# Patient Record
Sex: Female | Born: 1945
Health system: Southern US, Community
[De-identification: ages and names within clinical notes are randomized; demographics above are authoritative.]

## PROBLEM LIST (undated history)

## (undated) HISTORY — PX: TUBAL LIGATION: SHX77

## (undated) HISTORY — PX: BREAST BIOPSY: SHX20

---

## 1997-07-25 ENCOUNTER — Other Ambulatory Visit: Admission: RE | Admit: 1997-07-25 | Discharge: 1997-07-25 | Payer: Self-pay | Admitting: Obstetrics and Gynecology

## 1997-08-02 ENCOUNTER — Ambulatory Visit (HOSPITAL_COMMUNITY): Admission: RE | Admit: 1997-08-02 | Discharge: 1997-08-02 | Payer: Self-pay | Admitting: Obstetrics and Gynecology

## 2001-07-07 ENCOUNTER — Other Ambulatory Visit: Admission: RE | Admit: 2001-07-07 | Discharge: 2001-07-07 | Payer: Self-pay | Admitting: *Deleted

## 2001-07-14 ENCOUNTER — Other Ambulatory Visit: Admission: RE | Admit: 2001-07-14 | Discharge: 2001-07-14 | Payer: Self-pay | Admitting: *Deleted

## 2001-07-14 ENCOUNTER — Encounter (INDEPENDENT_AMBULATORY_CARE_PROVIDER_SITE_OTHER): Payer: Self-pay | Admitting: Specialist

## 2001-07-27 ENCOUNTER — Ambulatory Visit (HOSPITAL_COMMUNITY): Admission: RE | Admit: 2001-07-27 | Discharge: 2001-07-27 | Payer: Self-pay | Admitting: Oncology

## 2001-07-27 ENCOUNTER — Encounter: Payer: Self-pay | Admitting: Oncology

## 2001-07-29 ENCOUNTER — Other Ambulatory Visit: Admission: RE | Admit: 2001-07-29 | Discharge: 2001-07-29 | Payer: Self-pay | Admitting: Oncology

## 2001-11-11 ENCOUNTER — Ambulatory Visit: Admission: RE | Admit: 2001-11-11 | Discharge: 2002-01-15 | Payer: Self-pay | Admitting: *Deleted

## 2002-02-19 ENCOUNTER — Ambulatory Visit: Admission: RE | Admit: 2002-02-19 | Discharge: 2002-02-19 | Payer: Self-pay | Admitting: *Deleted

## 2002-02-26 ENCOUNTER — Ambulatory Visit: Admission: RE | Admit: 2002-02-26 | Discharge: 2002-02-26 | Payer: Self-pay | Admitting: *Deleted

## 2004-03-12 ENCOUNTER — Ambulatory Visit: Payer: Self-pay | Admitting: Oncology

## 2004-07-02 ENCOUNTER — Ambulatory Visit: Payer: Self-pay | Admitting: Oncology

## 2004-12-17 ENCOUNTER — Ambulatory Visit: Payer: Self-pay | Admitting: Oncology

## 2005-05-27 ENCOUNTER — Ambulatory Visit: Payer: Self-pay | Admitting: Oncology

## 2005-11-18 ENCOUNTER — Ambulatory Visit: Payer: Self-pay | Admitting: Oncology

## 2006-05-05 ENCOUNTER — Ambulatory Visit: Payer: Self-pay | Admitting: Oncology

## 2006-10-22 ENCOUNTER — Ambulatory Visit: Payer: Self-pay | Admitting: Oncology

## 2014-01-24 DIAGNOSIS — M25512 Pain in left shoulder: Secondary | ICD-10-CM | POA: Insufficient documentation

## 2014-01-24 DIAGNOSIS — M25522 Pain in left elbow: Secondary | ICD-10-CM

## 2014-01-24 HISTORY — DX: Pain in left elbow: M25.522

## 2014-01-24 HISTORY — DX: Pain in left shoulder: M25.512

## 2014-02-17 ENCOUNTER — Other Ambulatory Visit: Payer: Self-pay | Admitting: Orthopedic Surgery

## 2014-02-17 DIAGNOSIS — M25512 Pain in left shoulder: Secondary | ICD-10-CM

## 2014-02-17 DIAGNOSIS — S52125D Nondisplaced fracture of head of left radius, subsequent encounter for closed fracture with routine healing: Secondary | ICD-10-CM

## 2014-02-17 HISTORY — DX: Nondisplaced fracture of head of left radius, subsequent encounter for closed fracture with routine healing: S52.125D

## 2014-03-03 ENCOUNTER — Ambulatory Visit
Admission: RE | Admit: 2014-03-03 | Discharge: 2014-03-03 | Disposition: A | Discharge status: Home / Self Care | Payer: Medicare Other | Source: Ambulatory Visit | Attending: Orthopedic Surgery | Admitting: Orthopedic Surgery

## 2014-03-03 DIAGNOSIS — M25512 Pain in left shoulder: Secondary | ICD-10-CM

## 2014-04-25 DIAGNOSIS — Z9889 Other specified postprocedural states: Secondary | ICD-10-CM

## 2014-04-25 HISTORY — DX: Other specified postprocedural states: Z98.890

## 2015-06-12 DIAGNOSIS — C8589 Other specified types of non-Hodgkin lymphoma, extranodal and solid organ sites: Secondary | ICD-10-CM | POA: Diagnosis not present

## 2015-06-12 DIAGNOSIS — R0609 Other forms of dyspnea: Secondary | ICD-10-CM | POA: Diagnosis not present

## 2015-06-12 DIAGNOSIS — L989 Disorder of the skin and subcutaneous tissue, unspecified: Secondary | ICD-10-CM | POA: Diagnosis not present

## 2016-08-28 DIAGNOSIS — R413 Other amnesia: Secondary | ICD-10-CM | POA: Diagnosis not present

## 2016-08-28 DIAGNOSIS — D509 Iron deficiency anemia, unspecified: Secondary | ICD-10-CM | POA: Diagnosis not present

## 2016-08-28 DIAGNOSIS — Z9221 Personal history of antineoplastic chemotherapy: Secondary | ICD-10-CM | POA: Diagnosis not present

## 2016-08-28 DIAGNOSIS — Z8572 Personal history of non-Hodgkin lymphomas: Secondary | ICD-10-CM | POA: Diagnosis not present

## 2016-08-28 DIAGNOSIS — Z923 Personal history of irradiation: Secondary | ICD-10-CM | POA: Diagnosis not present

## 2016-08-28 DIAGNOSIS — R300 Dysuria: Secondary | ICD-10-CM | POA: Diagnosis not present

## 2017-04-15 DIAGNOSIS — R0602 Shortness of breath: Secondary | ICD-10-CM | POA: Diagnosis not present

## 2017-06-25 DIAGNOSIS — L600 Ingrowing nail: Secondary | ICD-10-CM | POA: Insufficient documentation

## 2017-06-25 DIAGNOSIS — L603 Nail dystrophy: Secondary | ICD-10-CM | POA: Insufficient documentation

## 2017-06-25 HISTORY — DX: Ingrowing nail: L60.0

## 2017-08-28 ENCOUNTER — Other Ambulatory Visit: Payer: Self-pay

## 2017-08-28 ENCOUNTER — Emergency Department (HOSPITAL_COMMUNITY): Payer: Medicare Other

## 2017-08-28 ENCOUNTER — Encounter (HOSPITAL_COMMUNITY): Payer: Self-pay | Admitting: Emergency Medicine

## 2017-08-28 ENCOUNTER — Emergency Department (HOSPITAL_COMMUNITY)
Admission: EM | Admit: 2017-08-28 | Discharge: 2017-08-29 | Disposition: A | Discharge status: Home / Self Care | Payer: Medicare Other | Attending: Emergency Medicine | Admitting: Emergency Medicine

## 2017-08-28 DIAGNOSIS — R791 Abnormal coagulation profile: Secondary | ICD-10-CM | POA: Insufficient documentation

## 2017-08-28 DIAGNOSIS — Z79899 Other long term (current) drug therapy: Secondary | ICD-10-CM | POA: Diagnosis not present

## 2017-08-28 DIAGNOSIS — R410 Disorientation, unspecified: Secondary | ICD-10-CM

## 2017-08-28 DIAGNOSIS — G4751 Confusional arousals: Secondary | ICD-10-CM | POA: Diagnosis not present

## 2017-08-28 DIAGNOSIS — Z7984 Long term (current) use of oral hypoglycemic drugs: Secondary | ICD-10-CM | POA: Insufficient documentation

## 2017-08-28 DIAGNOSIS — R413 Other amnesia: Secondary | ICD-10-CM

## 2017-08-28 LAB — I-STAT CHEM 8, ED
BUN: 13 mg/dL (ref 8–23)
CHLORIDE: 106 mmol/L (ref 98–111)
Calcium, Ion: 1.12 mmol/L — ABNORMAL LOW (ref 1.15–1.40)
Creatinine, Ser: 1 mg/dL (ref 0.44–1.00)
Glucose, Bld: 110 mg/dL — ABNORMAL HIGH (ref 70–99)
HEMATOCRIT: 37 % (ref 36.0–46.0)
HEMOGLOBIN: 12.6 g/dL (ref 12.0–15.0)
POTASSIUM: 3.8 mmol/L (ref 3.5–5.1)
Sodium: 140 mmol/L (ref 135–145)
TCO2: 23 mmol/L (ref 22–32)

## 2017-08-28 LAB — DIFFERENTIAL
Abs Immature Granulocytes: 0 10*3/uL (ref 0.0–0.1)
BASOS ABS: 0.1 10*3/uL (ref 0.0–0.1)
BASOS PCT: 1 %
EOS PCT: 4 %
Eosinophils Absolute: 0.3 10*3/uL (ref 0.0–0.7)
IMMATURE GRANULOCYTES: 0 %
LYMPHS PCT: 27 %
Lymphs Abs: 2.1 10*3/uL (ref 0.7–4.0)
Monocytes Absolute: 0.6 10*3/uL (ref 0.1–1.0)
Monocytes Relative: 8 %
NEUTROS ABS: 4.8 10*3/uL (ref 1.7–7.7)
Neutrophils Relative %: 60 %

## 2017-08-28 LAB — COMPREHENSIVE METABOLIC PANEL
ALBUMIN: 3.9 g/dL (ref 3.5–5.0)
ALK PHOS: 56 U/L (ref 38–126)
ALT: 19 U/L (ref 0–44)
ANION GAP: 9 (ref 5–15)
AST: 19 U/L (ref 15–41)
BUN: 13 mg/dL (ref 8–23)
CALCIUM: 9.2 mg/dL (ref 8.9–10.3)
CHLORIDE: 106 mmol/L (ref 98–111)
CO2: 24 mmol/L (ref 22–32)
Creatinine, Ser: 1.02 mg/dL — ABNORMAL HIGH (ref 0.44–1.00)
GFR calc Af Amer: >60 mL/min (ref >60)
GFR calc non Af Amer: 53 mL/min — ABNORMAL LOW (ref >60)
GLUCOSE: 116 mg/dL — AB (ref 70–99)
Potassium: 4 mmol/L (ref 3.5–5.1)
Sodium: 139 mmol/L (ref 135–145)
Total Bilirubin: 0.7 mg/dL (ref 0.3–1.2)
Total Protein: 6.9 g/dL (ref 6.5–8.1)

## 2017-08-28 LAB — CBC
HEMATOCRIT: 39 % (ref 36.0–46.0)
HEMOGLOBIN: 12.4 g/dL (ref 12.0–15.0)
MCH: 27.3 pg (ref 26.0–34.0)
MCHC: 31.8 g/dL (ref 30.0–36.0)
MCV: 85.7 fL (ref 78.0–100.0)
Platelets: 233 10*3/uL (ref 150–400)
RBC: 4.55 MIL/uL (ref 3.87–5.11)
RDW: 13.6 % (ref 11.5–15.5)
WBC: 8.2 10*3/uL (ref 4.0–10.5)

## 2017-08-28 LAB — PROTIME-INR
INR: 1.01
PROTHROMBIN TIME: 13.2 s (ref 11.4–15.2)

## 2017-08-28 LAB — APTT: aPTT: 42 seconds — ABNORMAL HIGH (ref 24–36)

## 2017-08-28 LAB — I-STAT TROPONIN, ED: Troponin i, poc: 0 ng/mL (ref 0.00–0.08)

## 2017-08-28 LAB — CBG MONITORING, ED: GLUCOSE-CAPILLARY: 109 mg/dL — AB (ref 70–99)

## 2017-08-28 MED ORDER — LORAZEPAM 2 MG/ML IJ SOLN
1.0000 mg | Freq: Once | INTRAMUSCULAR | Status: AC
  Administered 2017-08-28: 1 mg via INTRAVENOUS
  Filled 2017-08-28: qty 1

## 2017-08-28 NOTE — ED Notes (Signed)
Patient transported to MRI 

## 2017-08-28 NOTE — ED Provider Notes (Addendum)
[facility] EMERGENCY DEPARTMENT Provider Note   CSN: [id number] Arrival date & time: 08/28/17  1829     History   Chief Complaint Chief Complaint  Patient presents with  . Cerebrovascular Accident    HPI Nicole Patton is a 72 y.o. female who presents from PCP for MRI for concern for CVA.  Patient has been having memory issues and episodes of not making sense for the past several months.  Patient was found to have a UTI at her PCP, but she went to have a CT scan ordered by her PCP this morning and was called that she could have bleeding on her brain and that she needed to go to the emergency department to have an MRI.  Patient denies any vision changes, headache, nausea, vomiting, dizziness, lightheadedness, weakness, numbness or tingling, chest pain, shortness of breath, abdominal pain. She is already taking ABX for UTI.  HPI  History reviewed. No pertinent past medical history.  There are no active problems to display for this patient.   History reviewed. No pertinent surgical history.   OB History   None      Home Medications    Prior to Admission medications   Medication Sig Start Date End Date Taking? Authorizing Provider  ciprofloxacin (CIPRO) 500 MG tablet Take 500 mg by mouth 2 (two) times daily. For 10 days 08/25/17 09/04/17 Yes [provider]  ibuprofen (ADVIL,MOTRIN) 200 MG tablet Take 400 mg by mouth every 6 (six) hours as needed for mild pain or moderate pain.   Yes [provider]  loratadine (CLARITIN) 10 MG tablet Take 10 mg by mouth daily.   Yes [provider]  lovastatin (MEVACOR) 10 MG tablet Take 10 mg by mouth daily. 06/25/17  Yes [provider]  metFORMIN (GLUCOPHAGE) 500 MG tablet Take 500 mg by mouth daily. 08/12/17  Yes [provider]  mirabegron ER (MYRBETRIQ) 25 MG TB24 tablet Take 25 mg by mouth daily.   Yes [provider]  omeprazole (PRILOSEC) 20 MG capsule Take 20 mg by  mouth daily.   Yes [provider]    Family History No family history on file.  Social History Social History   Tobacco Use  . Smoking status: Not on file  Substance Use Topics  . Alcohol use: Not on file  . Drug use: Not on file     Allergies   Codeine; Penicillin g; and Shellfish allergy   Review of Systems Review of Systems  Constitutional: Negative for chills and fever.  HENT: Negative for facial swelling and sore throat.   Respiratory: Negative for shortness of breath.   Cardiovascular: Negative for chest pain.  Gastrointestinal: Negative for abdominal pain, nausea and vomiting.  Genitourinary: Negative for dysuria.  Musculoskeletal: Negative for back pain.  Skin: Negative for rash and wound.  Neurological: Negative for headaches.  Psychiatric/Behavioral: The patient is not nervous/anxious.      Physical Exam Updated Vital Signs BP (!) 133/56   Pulse 82   Temp 98.7 F (37.1 C) (Oral)   Resp 13   Ht 5\' 8"  (1.727 m)   Wt 97.5 kg (215 lb)   SpO2 98%   BMI 32.69 kg/m   Physical Exam  Constitutional: She appears well-developed and well-nourished. No distress.  HENT:  Head: Normocephalic and atraumatic.  Mouth/Throat: Oropharynx is clear and moist. No oropharyngeal exudate.  Eyes: Pupils are equal, round, and reactive to light. Conjunctivae and EOM are normal. Right eye exhibits no  discharge. Left eye exhibits no discharge. No scleral icterus.  Neck: Normal range of motion. Neck supple. No thyromegaly present.  Cardiovascular: Normal rate, regular rhythm, normal heart sounds and intact distal pulses. Exam reveals no gallop and no friction rub.  No murmur heard. Pulmonary/Chest: Effort normal and breath sounds normal. No stridor. No respiratory distress. She has no wheezes. She has no rales.  Abdominal: Soft. Bowel sounds are normal. She exhibits no distension. There is no tenderness. There is no rebound and no guarding.  Musculoskeletal: She  exhibits no edema.  Lymphadenopathy:    She has no cervical adenopathy.  Neurological: She is alert. Coordination normal.  CN 3-12 intact; normal sensation throughout; 5/5 strength in all 4 extremities; equal bilateral grip strength; normal heel-to-shin, no ataxia on finger-to-nose  Skin: Skin is warm and dry. No rash noted. She is not diaphoretic. No pallor.  Psychiatric: She has a normal mood and affect.  Nursing note and vitals reviewed.    ED Treatments / Results  Labs (all labs ordered are listed, but only abnormal results are displayed) Labs Reviewed  APTT - Abnormal; Notable for the following components:      Result Value   aPTT 42 (*)    All other components within normal limits  COMPREHENSIVE METABOLIC PANEL - Abnormal; Notable for the following components:   Glucose, Bld 116 (*)    Creatinine, Ser 1.02 (*)    GFR calc non Af Amer 53 (*)    All other components within normal limits  CBG MONITORING, ED - Abnormal; Notable for the following components:   Glucose-Capillary 109 (*)    All other components within normal limits  I-STAT CHEM 8, ED - Abnormal; Notable for the following components:   Glucose, Bld 110 (*)    Calcium, Ion 1.12 (*)    All other components within normal limits  CBC  DIFFERENTIAL  PROTIME-INR  I-STAT TROPONIN, ED    EKG None  Radiology No results found.  Procedures Procedures (including critical care time)  Medications Ordered in ED Medications  LORazepam (ATIVAN) injection 1 mg (has no administration in time range)     Initial Impression / Assessment and Plan / ED Course  I have reviewed the triage vital signs and the nursing notes.  Pertinent labs & imaging results that were available during my care of the patient were reviewed by me and considered in my medical decision making (see chart for details).     Patient's labs are unremarkable except for APTT 42 and creatinine 1.02.  MRI is pending. At shift change, patient care  transferred to Charlann Lange, PA-C for continued evaluation, follow up of MRI and determination of disposition. Anticipate discharge if MRI negative. Patient has antibiotics for UTI and will follow up to PCP for further workup of pregressively worsening memory issues.   Final Clinical Impressions(s) / ED Diagnoses   Final diagnoses:  None    ED Discharge Orders    None         Frederica Kuster, PA-C 08/28/17 2205    Julianne Rice, MD 09/02/17 [phone]

## 2017-08-28 NOTE — ED Provider Notes (Addendum)
Sent from PCP for MR after abnormal CT, per daughter,upsti this am Months of confusion,, worsening memory, intermittent UTI on Monday, on abx If neg, can go home to f/u with PCP  CT results from PACs:  IMPRESSION: 1. Small focus of high attenuation in the right cerebellum of uncertain significance. Small petechial hemorrhage is difficult to exclude. 2. More vague peripheral slightly higher attenuation focus in the right posterior parietal region. If small petechial hemorrhage or hemorrhagic metastasis would be considerations, then MRI of the brain is recommended. 3. Atrophy and changes of small vessel ischemic change particularly in the posterior periventricular white matter.  12:15 - patient in MRI now.  1:30 - MR shows no acute findings. Motion degraded study.   IMPRESSION: 1. Motion degraded study. 2. Small nonspecific right lateral parietal cortex lesion with features suggesting cavernoma. Suboptimal assessment due to motion artifact. Consider repeat MRI of the brain with and without contrast when patient is able to hold still, preferably as an outpatient. 3. Tiny focus of susceptibility hypointensity within the right inferior cerebellum corresponding to density on CT, probably a calcification, less likely petechial hemorrhage. 4. No evidence for stroke or mass effect. 5. Moderate chronic microvascular ischemic changes and mild parenchymal volume loss of the brain.  These results were discussed with the patient and daughter, including consideration for need for outpatient MRI w and w/o CM. All questions answered. She and daughter are comfortable with discharge home.     Charlann Lange, PA-C 08/29/17 0015    Charlann Lange, PA-C 08/29/17 0131    Julianne Rice, MD 09/02/17 [phone]

## 2017-08-28 NOTE — ED Triage Notes (Signed)
Pt sent by doctor, CT done this morning that is concerning for "bleeding on brain" per pt's daughter. Family reports pt has been having memory issues in the last few weeks. A&o X 4 at this time, moves extremities well.

## 2017-08-28 NOTE — ED Provider Notes (Signed)
Patient placed in Quick Look pathway, seen and evaluated   Chief Complaint: memory loss  HPI:   Nicole Patton is a 72 y.o. female who presents to the ED with her daughter after getting a phone call from her PCP. Patient's daughter states that the patient has had memory problems and has started talking that doesn't make sense. Patient was evaluated by her PCP today and started on antibiotics for UTI and had CT of head. PCP called and told patient's daughter that it appears she may have a bleed and to come in for MRI.   ROS: GU: UTI  Physical Exam:  BP 128/74 (BP Location: Right Arm)   Pulse 96   Temp 98.7 F (37.1 C) (Oral)   Resp 16   Ht 5\' 8"  (1.727 m)   Wt 97.5 kg (215 lb)   SpO2 98%   BMI 32.69 kg/m    Gen: No distress  Neuro: Awake and Alert  Skin: Warm and dry      Initiation of care has begun. The patient has been counseled on the process, plan, and necessity for staying for the completion/evaluation, and the remainder of the medical screening examination    Ashley Murrain, NP 08/28/17 1859    Noemi Chapel, MD 09/02/17 2049

## 2017-08-29 NOTE — Discharge Instructions (Addendum)
Follow up with Dr. Allean Found for recheck and any further outpatient studies necessary. The report from the MRI is provided here to share with your doctor and can be found in the [facility] system for which your doctor has access.  IMPRESSION: 1. Motion degraded study. 2. Small nonspecific right lateral parietal cortex lesion with features suggesting cavernoma. Suboptimal assessment due to motion artifact. Consider repeat MRI of the brain with and without contrast when patient is able to hold still, preferably as an outpatient. 3. Tiny focus of susceptibility hypointensity within the right inferior cerebellum corresponding to density on CT, probably a calcification, less likely petechial hemorrhage. 4. No evidence for stroke or mass effect. 5. Moderate chronic microvascular ischemic changes and mild parenchymal volume loss of the brain.

## 2017-09-29 DIAGNOSIS — G939 Disorder of brain, unspecified: Secondary | ICD-10-CM

## 2017-09-29 HISTORY — DX: Disorder of brain, unspecified: G93.9

## 2017-09-30 DIAGNOSIS — Z803 Family history of malignant neoplasm of breast: Secondary | ICD-10-CM | POA: Diagnosis not present

## 2017-09-30 DIAGNOSIS — Z8572 Personal history of non-Hodgkin lymphomas: Secondary | ICD-10-CM | POA: Diagnosis not present

## 2017-09-30 DIAGNOSIS — R413 Other amnesia: Secondary | ICD-10-CM

## 2017-12-04 NOTE — Progress Notes (Signed)
NEUROLOGY CONSULTATION NOTE  Nicole Patton MRN: [id number] DOB: Aug 04, 1945  Referring provider: Consuella Lose, MD Primary care provider: Heide Scales, NP  Reason for consult:  stroke  HISTORY OF PRESENT ILLNESS: Nicole Patton is a 72 year old right-handed female with hypercholesterolemia, diabetes and remote history of lymphoma who presents for stroke.  She is accompanied by her sister who supplements history.  History is also supplemented by ED and referring providers note.  MRI of brain from July and October personally reviewed.  In July she fell and hit her head while walking down steps.  She doesn't think she lost consciousness.  Afterward, she developed memory problems.  She used to be a Chief Operating Officer.  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  She denies disorientation driving on familiar routes.  She does not think she was repeating questions (nobody called her out on it).  She had a CT of the head on 08/28/17, which reportedly showed an abnormality on the brain.  She was told to go to the ED that day.   MRI of brain without contrast was performed, which demonstrated a small lesion in the right lateral parietal cortex suspicious for cavernoma.  She was referred to Dr. Kathyrn Sheriff at [facility] in August.  MRI of brain with and without contrast was performed, demonstrating chronic small vessel ischemic changes as well as a heterogeneously enhancing lesion in the right parietal-occipital cortex, however enhancement pattern now appeared more gyriform, felt to more likely be a subacute infarct.  Repeat imaging in 6 months was recommended.  She subsequently had a repeat MRI of brain with and without contrast on 11/24/17 which demonstrated stable focus of susceptibility artifact in the right parieto-occipital lobe.  Given that the size had remained unchanged, and no presence of new lesions was seen, malignancy was felt unlikely.  She  currently feels better.  PAST MEDICAL HISTORY: Hypercholesterolemia Type 2 diabetes  Lymphoma  PAST SURGICAL HISTORY: No past surgical history on file.  MEDICATIONS: Current Outpatient Medications on File Prior to Visit  Medication Sig Dispense Refill  . ibuprofen (ADVIL,MOTRIN) 200 MG tablet Take 400 mg by mouth every 6 (six) hours as needed for mild pain or moderate pain.    Marland Kitchen loratadine (CLARITIN) 10 MG tablet Take 10 mg by mouth daily.    Marland Kitchen lovastatin (MEVACOR) 10 MG tablet Take 10 mg by mouth daily.  0  . metFORMIN (GLUCOPHAGE) 500 MG tablet Take 500 mg by mouth daily.  0  . mirabegron ER (MYRBETRIQ) 25 MG TB24 tablet Take 25 mg by mouth daily.    Marland Kitchen omeprazole (PRILOSEC) 20 MG capsule Take 20 mg by mouth daily.     No current facility-administered medications on file prior to visit.     ALLERGIES: Allergies  Allergen Reactions  . Codeine Nausea And Vomiting  . Penicillin G Swelling    Has patient had a PCN reaction causing immediate rash, facial/tongue/throat swelling, SOB or lightheadedness with hypotension: Yes Has patient had a PCN reaction causing severe rash involving mucus membranes or skin necrosis: No Has patient had a PCN reaction that required hospitalization: No Has patient had a PCN reaction occurring within the last 10 years: No If all of the above answers are "NO", then may proceed with Cephalosporin use.   . Shellfish Allergy Hives and Rash    FAMILY HISTORY: No family history on file.  SOCIAL HISTORY: Social History   Socioeconomic History  .  Marital status: Divorced    Spouse name: Not on file  . Number of children: Not on file  . Years of education: Not on file  . Highest education level: Not on file  Occupational History  . Not on file  Social Needs  . Financial resource strain: Not on file  . Food insecurity:    Worry: Not on file    Inability: Not on file  . Transportation needs:    Medical: Not on file    Non-medical: Not on file    Tobacco Use  . Smoking status: Not on file  Substance and Sexual Activity  . Alcohol use: Not on file  . Drug use: Not on file  . Sexual activity: Not on file  Lifestyle  . Physical activity:    Days per week: Not on file    Minutes per session: Not on file  . Stress: Not on file  Relationships  . Social connections:    Talks on phone: Not on file    Gets together: Not on file    Attends religious service: Not on file    Active member of club or organization: Not on file    Attends meetings of clubs or organizations: Not on file    Relationship status: Not on file  . Intimate partner violence:    Fear of current or ex partner: Not on file    Emotionally abused: Not on file    Physically abused: Not on file    Forced sexual activity: Not on file  Other Topics Concern  . Not on file  Social History Narrative  . Not on file    REVIEW OF SYSTEMS: Constitutional: No fevers, chills, or sweats, no generalized fatigue, change in appetite Eyes: No visual changes, double vision, eye pain Ear, nose and throat: No hearing loss, ear pain, nasal congestion, sore throat Cardiovascular: No chest pain, palpitations Respiratory:  No shortness of breath at rest or with exertion, wheezes GastrointestinaI: No nausea, vomiting, diarrhea, abdominal pain, fecal incontinence Genitourinary:  No dysuria, urinary retention or frequency Musculoskeletal:  No neck pain, back pain Integumentary: No rash, pruritus, skin lesions Neurological: as above Psychiatric: No depression, insomnia, anxiety Endocrine: No palpitations, fatigue, diaphoresis, mood swings, change in appetite, change in weight, increased thirst Hematologic/Lymphatic:  No purpura, petechiae. Allergic/Immunologic: no itchy/runny eyes, nasal congestion, recent allergic reactions, rashes  PHYSICAL EXAM: Blood pressure 118/76, pulse 80, height 5' 8.5" (1.74 m), weight 217 lb (98.4 kg), SpO2 98 %. General: No acute distress.  Patient  appears well-groomed.  Head:  Normocephalic/atraumatic Eyes:  fundi examined but not visualized Neck: supple, no paraspinal tenderness, full range of motion Back: No paraspinal tenderness Heart: regular rate and rhythm Lungs: Clear to auscultation bilaterally. Vascular: No carotid bruits. Neurological Exam: Mental status: alert and oriented to person, place, and time, recent and remote memory intact, fund of knowledge intact, attention and concentration intact, speech fluent and not dysarthric, language intact. Cranial nerves: CN I: not tested CN II: pupils equal, round and reactive to light, visual fields intact CN III, IV, VI:  full range of motion, no nystagmus, no ptosis CN V: facial sensation intact CN VII: upper and lower face symmetric CN VIII: hearing intact CN IX, X: gag intact, uvula midline CN XI: sternocleidomastoid and trapezius muscles intact CN XII: tongue midline Bulk & Tone: normal, no fasciculations. Motor:  5/5 throughout  Sensation: temperature and vibration sensation intact. Deep Tendon Reflexes:  2+ throughout, toes downgoing.  Finger to nose  testing:  Without dysmetria.  Heel to shin:  Without dysmetria.  Gait:  Normal station and stride. Romberg negative.  IMPRESSION: Abnormal finding on brain MRI.  Finding somewhat unusual for ischemic infarct.  Previous confusion could possibly have been postconcussion syndrome.   PLAN: 1.  Start ASA 81mg  daily 2.  She is already on statin therapy 3.  Will repeat MRI of brain with and without contrast in 6 months 4.  Follow up after repeat imaging.  Thank you for allowing me to take part in the care of this patient.  Metta Clines, DO

## 2017-12-05 ENCOUNTER — Encounter: Payer: Self-pay | Admitting: Neurology

## 2017-12-05 ENCOUNTER — Other Ambulatory Visit: Payer: Self-pay

## 2017-12-05 ENCOUNTER — Ambulatory Visit (INDEPENDENT_AMBULATORY_CARE_PROVIDER_SITE_OTHER): Payer: Medicare Other | Admitting: Neurology

## 2017-12-05 VITALS — BP 118/76 | HR 80 | Ht 68.5 in | Wt 217.0 lb

## 2017-12-05 DIAGNOSIS — R9089 Other abnormal findings on diagnostic imaging of central nervous system: Secondary | ICD-10-CM | POA: Diagnosis not present

## 2017-12-05 NOTE — Patient Instructions (Signed)
I am not completely sure if it is a stroke 1.  I recommend repeating MRI of brain with and without contrast in 6 months.  Follow up with me afterward. 2.  In the meantime, start taking aspirin EC 81mg  daily

## 2018-05-25 ENCOUNTER — Other Ambulatory Visit: Payer: Medicare Other

## 2018-05-27 ENCOUNTER — Ambulatory Visit
Admission: RE | Admit: 2018-05-27 | Discharge: 2018-05-27 | Disposition: A | Discharge status: Home / Self Care | Payer: Medicare Other | Source: Ambulatory Visit | Attending: Neurology | Admitting: Neurology

## 2018-05-27 ENCOUNTER — Other Ambulatory Visit: Payer: Self-pay

## 2018-05-27 DIAGNOSIS — R9089 Other abnormal findings on diagnostic imaging of central nervous system: Secondary | ICD-10-CM

## 2018-05-27 MED ORDER — GADOBENATE DIMEGLUMINE 529 MG/ML IV SOLN
20.0000 mL | Freq: Once | INTRAVENOUS | Status: AC | PRN
  Administered 2018-05-27: 11:00:00 20 mL via INTRAVENOUS

## 2018-05-29 ENCOUNTER — Telehealth: Payer: Self-pay | Admitting: General Surgery

## 2018-05-29 NOTE — Telephone Encounter (Signed)
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  She has some other tiny ones as well.  It is typically benign and does not cause any symptoms.  It is not a stroke or tumor.  I don't think we need to repeat imaging unless she notes any new symptoms.

## 2018-05-29 NOTE — Telephone Encounter (Signed)
Contacted the patient and she is hard of hearing, she handed the phone to her son Octavia Bruckner. Verified patients name and DOB. Explained to [employer] Dr Georgie Chard note and her verbalized understanding. Expressed if any symptoms arose she needed to get in contact to schedule an appointment.

## 2018-07-27 ENCOUNTER — Telehealth: Payer: Self-pay | Admitting: Neurology

## 2018-07-27 NOTE — Telephone Encounter (Signed)
Faxed last OV and MRI report to pt PCP [place] FNP per Suzanne's request. Fax [phone].

## 2018-09-28 ENCOUNTER — Ambulatory Visit (INDEPENDENT_AMBULATORY_CARE_PROVIDER_SITE_OTHER): Payer: Medicare Other | Admitting: Cardiology

## 2018-09-28 ENCOUNTER — Other Ambulatory Visit: Payer: Self-pay

## 2018-09-28 ENCOUNTER — Encounter: Payer: Self-pay | Admitting: Cardiology

## 2018-09-28 DIAGNOSIS — R079 Chest pain, unspecified: Secondary | ICD-10-CM

## 2018-09-28 DIAGNOSIS — R0789 Other chest pain: Secondary | ICD-10-CM

## 2018-09-28 DIAGNOSIS — R0609 Other forms of dyspnea: Secondary | ICD-10-CM

## 2018-09-28 DIAGNOSIS — E785 Hyperlipidemia, unspecified: Secondary | ICD-10-CM

## 2018-09-28 DIAGNOSIS — R0600 Dyspnea, unspecified: Secondary | ICD-10-CM

## 2018-09-28 HISTORY — DX: Other forms of dyspnea: R06.09

## 2018-09-28 HISTORY — DX: Hyperlipidemia, unspecified: E78.5

## 2018-09-28 HISTORY — DX: Dyspnea, unspecified: R06.00

## 2018-09-28 HISTORY — DX: Other chest pain: R07.89

## 2018-09-28 NOTE — Patient Instructions (Signed)
Medication Instructions:  Your physician recommends that you continue on your current medications as directed. Please refer to the Current Medication list given to you today.  If you need a refill on your cardiac medications before your next appointment, please call your pharmacy.   Lab work: NONE  If you have labs (blood work) drawn today and your tests are completely normal, you will receive your results only by:  [place] (if you have MyChart) OR  A paper copy in the mail If you have any lab test that is abnormal or we need to change your treatment, we will call you to review the results.  Testing/Procedures: Your physician has requested that you have a lexiscan myoview. For further information please visit [email or web address]. Please follow instruction sheet, as given.  Your physician has requested that you have an echocardiogram. Echocardiography is a painless test that uses sound waves to create images of your heart. It provides your doctor with information about the size and shape of your heart and how well your hearts chambers and valves are working. This procedure takes approximately one hour. There are no restrictions for this procedure.    Follow-Up: At [facility], you and your health needs are our priority.  As part of our continuing mission to provide you with exceptional heart care, we have created designated Provider Care Teams.  These Care Teams include your primary Cardiologist (physician) and Advanced Practice Providers (APPs -  Physician Assistants and Nurse Practitioners) who all work together to provide you with the care you need, when you need it. You will need a follow up appointment in 6 weeks.  Please call our office 2 months in advance to schedule this appointment.  You may see No primary care provider on file. or another member of our [employer] Provider Team in [place]: Shirlee More, MD  Jyl Heinz, MD   Any Other Special Instructions  Will Be Listed Below (If Applicable).   Cardiac Nuclear Scan A cardiac nuclear scan is a test that measures blood flow to the heart when a person is resting and when he or she is exercising. The test looks for problems such as:  Not enough blood reaching a portion of the heart.  The heart muscle not working normally. You may need this test if:  You have heart disease.  You have had abnormal lab results.  You have had heart surgery or a balloon procedure to open up blocked arteries (angioplasty).  You have chest pain.  You have shortness of breath. In this test, a radioactive dye (tracer) is injected into your bloodstream. After the tracer has traveled to your heart, an imaging device is used to measure how much of the tracer is absorbed by or distributed to various areas of your heart. This procedure is usually done at a hospital and takes 2-4 hours. Tell a health care provider about:  Any allergies you have.  All medicines you are taking, including vitamins, herbs, eye drops, creams, and over-the-counter medicines.  Any problems you or family members have had with anesthetic medicines.  Any blood disorders you have.  Any surgeries you have had.  Any medical conditions you have.  Whether you are pregnant or may be pregnant. What are the risks? Generally, this is a safe procedure. However, problems may occur, including:  Serious chest pain and heart attack. This is only a risk if the stress portion of the test is done.  Rapid heartbeat.  Sensation of warmth in your chest.  This usually passes quickly.  Allergic reaction to the tracer. What happens before the procedure?  Ask your health care provider about changing or stopping your regular medicines. This is especially important if you are taking diabetes medicines or blood thinners.  Follow instructions from your health care provider about eating or drinking restrictions.  Remove your jewelry on the day of the  procedure. What happens during the procedure?  An IV will be inserted into one of your veins.  Your health care provider will inject a small amount of radioactive tracer through the IV.  You will wait for 20-40 minutes while the tracer travels through your bloodstream.  Your heart activity will be monitored with an electrocardiogram (ECG).  You will lie down on an exam table.  Images of your heart will be taken for about 15-20 minutes.  You may also have a stress test. For this test, one of the following may be done: ? You will exercise on a treadmill or stationary bike. While you exercise, your heart's activity will be monitored with an ECG, and your blood pressure will be checked. ? You will be given medicines that will increase blood flow to parts of your heart. This is done if you are unable to exercise.  When blood flow to your heart has peaked, a tracer will again be injected through the IV.  After 20-40 minutes, you will get back on the exam table and have more images taken of your heart.  Depending on the type of tracer used, scans may need to be repeated 3-4 hours later.  Your IV line will be removed when the procedure is over. The procedure may vary among health care providers and hospitals. What happens after the procedure?  Unless your health care provider tells you otherwise, you may return to your normal schedule, including diet, activities, and medicines.  Unless your health care provider tells you otherwise, you may increase your fluid intake. This will help to flush the contrast dye from your body. Drink enough fluid to keep your urine pale yellow.  Ask your health care provider, or the department that is doing the test: ? When will my results be ready? ? How will I get my results? Summary  A cardiac nuclear scan measures the blood flow to the heart when a person is resting and when he or she is exercising.  Tell your health care provider if you are  pregnant.  Before the procedure, ask your health care provider about changing or stopping your regular medicines. This is especially important if you are taking diabetes medicines or blood thinners.  After the procedure, unless your health care provider tells you otherwise, increase your fluid intake. This will help flush the contrast dye from your body.  After the procedure, unless your health care provider tells you otherwise, you may return to your normal schedule, including diet, activities, and medicines. This information is not intended to replace advice given to you by your health care provider. Make sure you discuss any questions you have with your health care provider. Document Released: 02/23/2004 Document Revised: 07/14/2017 Document Reviewed: 07/14/2017 Elsevier Patient Education  [place].  Echocardiogram An echocardiogram is a procedure that uses painless sound waves (ultrasound) to produce an image of the heart. Images from an echocardiogram can provide important information about:  Signs of coronary artery disease (CAD).  Aneurysm detection. An aneurysm is a weak or damaged part of an artery wall that bulges out from the normal force of  blood pumping through the body.  Heart size and shape. Changes in the size or shape of the heart can be associated with certain conditions, including heart failure, aneurysm, and CAD.  Heart muscle function.  Heart valve function.  Signs of a past heart attack.  Fluid buildup around the heart.  Thickening of the heart muscle.  A tumor or infectious growth around the heart valves. Tell a health care provider about:  Any allergies you have.  All medicines you are taking, including vitamins, herbs, eye drops, creams, and over-the-counter medicines.  Any blood disorders you have.  Any surgeries you have had.  Any medical conditions you have.  Whether you are pregnant or may be pregnant. What are the risks? Generally,  this is a safe procedure. However, problems may occur, including:  Allergic reaction to dye (contrast) that may be used during the procedure. What happens before the procedure? No specific preparation is needed. You may eat and drink normally. What happens during the procedure?   An IV tube may be inserted into one of your veins.  You may receive contrast through this tube. A contrast is an injection that improves the quality of the pictures from your heart.  A gel will be applied to your chest.  A wand-like tool (transducer) will be moved over your chest. The gel will help to transmit the sound waves from the transducer.  The sound waves will harmlessly bounce off of your heart to allow the heart images to be captured in real-time motion. The images will be recorded on a computer. The procedure may vary among health care providers and hospitals. What happens after the procedure?  You may return to your normal, everyday life, including diet, activities, and medicines, unless your health care provider tells you not to do that. Summary  An echocardiogram is a procedure that uses painless sound waves (ultrasound) to produce an image of the heart.  Images from an echocardiogram can provide important information about the size and shape of your heart, heart muscle function, heart valve function, and fluid buildup around your heart.  You do not need to do anything to prepare before this procedure. You may eat and drink normally.  After the echocardiogram is completed, you may return to your normal, everyday life, unless your health care provider tells you not to do that. This information is not intended to replace advice given to you by your health care provider. Make sure you discuss any questions you have with your health care provider. Document Released: 01/26/2000 Document Revised: 05/21/2018 Document Reviewed: 03/02/2016 Elsevier Patient Education  2020 [employer].

## 2018-09-28 NOTE — Progress Notes (Signed)
Cardiology Consultation:    Date:  09/28/2018   ID:  Nicole Patton, DOB 06-26-1945, MRN [id number]  PCP:  Imagene Riches, NP  Cardiologist:  Jenne Campus, MD   Referring MD: Imagene Riches, NP   Chief Complaint  Patient presents with  . Chest Pain  I have a chest pain  History of Present Illness:    Nicole Patton is a 73 y.o. female who is being seen today for the evaluation of chest pain at the request of York, Regina F, NP.  She is hard of hearing therefore conversation somewhat difficult.  She got her friend with her who helps with conversation.  She described to have sharp stabbing kind of chest pain under left side of her breast also some heavy tightness sensation that actually led to visit in the emergency room.  She ruled out for myocardial infarction but she would like to be established as a patient find out what the pain is coming from.  It is not exertional last for few minutes there is no shortness of breath no sweating associated with this.  She is also getting easily short of breath.  That this is somewhat new.  Interestingly a few months ago she ended up having a ruptured appendix that required surgery.  She spent about a week in the hospital.  Risk factors for coronary artery disease include dyslipidemia as well as history of smoking.  She quit smoking 20 years ago.  She also got history of lymphoma.  She did have quite extensive chemotherapy after that that was 20 years ago  No past medical history on file.  No past surgical history on file.  Current Medications: No outpatient medications have been marked as taking for the 09/28/18 encounter (Office Visit) with Park Liter, MD.     Allergies:   Codeine, Penicillin g, and Shellfish allergy   Social History   Socioeconomic History  . Marital status: Divorced    Spouse name: Not on file  . Number of children: Not on file  . Years of education: Not on file  . Highest education level: Not on file   Occupational History  . Not on file  Social Needs  . Financial resource strain: Not on file  . Food insecurity    Worry: Not on file    Inability: Not on file  . Transportation needs    Medical: Not on file    Non-medical: Not on file  Tobacco Use  . Smoking status: Former Research scientist (life sciences)  . Smokeless tobacco: Never Used  Substance and Sexual Activity  . Alcohol use: Not on file    Comment: only on special occasions  . Drug use: Never  . Sexual activity: Not on file  Lifestyle  . Physical activity    Days per week: Not on file    Minutes per session: Not on file  . Stress: Not on file  Relationships  . Social Herbalist on phone: Not on file    Gets together: Not on file    Attends religious service: Not on file    Active member of club or organization: Not on file    Attends meetings of clubs or organizations: Not on file    Relationship status: Not on file  Other Topics Concern  . Not on file  Social History Narrative   Pt lives alone in 2 story home   Has 3 children   Received G[facility] from [facility]  Retired Chief Operating Officer      Family History: The patient's family history is not on file. ROS:   Please see the history of present illness.    All 14 point review of systems negative except as described per history of present illness.  EKGs/Labs/Other Studies Reviewed:    The following studies were reviewed today:   EKG:  EKG is  ordered today.  The ekg ordered today demonstrates normal sinus rhythm normal P interval criteria for LVH.  Recent Labs: No results found for requested labs within last 8760 hours.  Recent Lipid Panel No results found for: CHOL, TRIG, HDL, CHOLHDL, VLDL, LDLCALC, LDLDIRECT  Physical Exam:    VS:  BP 126/68   Pulse 81   Ht 5\' 8"  (1.727 m)   Wt 222 lb 6.4 oz (100.9 kg)   SpO2 98%   BMI 33.82 kg/m     Wt Readings from Last 3 Encounters:  09/28/18 222 lb 6.4 oz (100.9 kg)  12/05/17 217 lb (98.4 kg)  08/28/17 215 lb (97.5 kg)     GEN:   Well nourished, well developed in no acute distress HEENT: Normal NECK: No JVD; No carotid bruits LYMPHATICS: No lymphadenopathy CARDIAC: RRR, no murmurs, no rubs, no gallops RESPIRATORY:  Clear to auscultation without rales, wheezing or rhonchi  ABDOMEN: Soft, non-tender, non-distended MUSCULOSKELETAL:  No edema; No deformity  SKIN: Warm and dry NEUROLOGIC:  Alert and oriented x 3 PSYCHIATRIC:  Normal affect   ASSESSMENT:    1. Atypical chest pain   2. Dyspnea on exertion   3. Dyslipidemia    PLAN:    In order of problems listed above:  1. Atypical chest pain.  I will schedule her to have a Lexiscan.  As a part of evaluation I will ask her also to have echocardiogram to assess left ventricular ejection fraction. 2. Dyspnea on exertion will do echocardiogram to assess left ventricular ejection fraction 3. Dyslipidemia she is on Mevacor which I will continue.  Will call primary care physician to get fasting lipid profile.  I have a low right lower level of suspicion that she does have critical/significant coronary artery disease however because of her symptoms and risk factors that need to be investigated.  She does have history of distant therefore I will not put her on aspirin   Medication Adjustments/Labs and Tests Ordered: Current medicines are reviewed at length with the patient today.  Concerns regarding medicines are outlined above.  No orders of the defined types were placed in this encounter.  No orders of the defined types were placed in this encounter.   Signed, Park Liter, MD, [facility]. 09/28/2018 4:18 PM    [facility]

## 2018-09-29 NOTE — Addendum Note (Signed)
Addended by: Aleatha Borer on: 09/29/2018 01:32 PM   Modules accepted: Orders

## 2018-09-30 ENCOUNTER — Telehealth (HOSPITAL_COMMUNITY): Payer: Self-pay | Admitting: *Deleted

## 2018-09-30 NOTE — Telephone Encounter (Signed)
Patient given detailed instructions per Myocardial Perfusion Study Information Sheet for the test on 10/02/18 at 7:15. Patient notified to arrive 15 minutes early and that it is imperative to arrive on time for appointment to keep from having the test rescheduled.  If you need to cancel or reschedule your appointment, please call the office within 24 hours of your appointment. . Patient verbalized understanding.Nicole Patton

## 2018-10-01 DIAGNOSIS — C8581 Other specified types of non-Hodgkin lymphoma, lymph nodes of head, face, and neck: Secondary | ICD-10-CM

## 2018-10-02 ENCOUNTER — Ambulatory Visit (HOSPITAL_COMMUNITY): Payer: Medicare Other | Attending: Internal Medicine

## 2018-10-02 ENCOUNTER — Ambulatory Visit (HOSPITAL_BASED_OUTPATIENT_CLINIC_OR_DEPARTMENT_OTHER): Payer: Medicare Other

## 2018-10-02 ENCOUNTER — Other Ambulatory Visit: Payer: Self-pay

## 2018-10-02 VITALS — Ht 68.0 in | Wt 222.0 lb

## 2018-10-02 DIAGNOSIS — R0789 Other chest pain: Secondary | ICD-10-CM

## 2018-10-02 DIAGNOSIS — R0609 Other forms of dyspnea: Secondary | ICD-10-CM | POA: Insufficient documentation

## 2018-10-02 DIAGNOSIS — R079 Chest pain, unspecified: Secondary | ICD-10-CM

## 2018-10-02 LAB — MYOCARDIAL PERFUSION IMAGING
LV dias vol: 55 mL (ref 46–106)
LV sys vol: 19 mL
Peak HR: 105 {beats}/min
Rest HR: 78 {beats}/min
SDS: 1
SRS: 0
SSS: 1
TID: 0.87

## 2018-10-02 LAB — ECHOCARDIOGRAM COMPLETE
Height: 68 in
Weight: 3552 oz

## 2018-10-02 MED ORDER — TECHNETIUM TC 99M TETROFOSMIN IV KIT
10.8000 | PACK | Freq: Once | INTRAVENOUS | Status: AC | PRN
  Administered 2018-10-02: 10.8 via INTRAVENOUS
  Filled 2018-10-02: qty 11

## 2018-10-02 MED ORDER — REGADENOSON 0.4 MG/5ML IV SOLN
0.4000 mg | Freq: Once | INTRAVENOUS | Status: AC
  Administered 2018-10-02: 0.4 mg via INTRAVENOUS

## 2018-10-02 MED ORDER — TECHNETIUM TC 99M TETROFOSMIN IV KIT
32.5000 | PACK | Freq: Once | INTRAVENOUS | Status: AC | PRN
  Administered 2018-10-02: 32.5 via INTRAVENOUS
  Filled 2018-10-02: qty 33

## 2018-10-05 ENCOUNTER — Telehealth: Payer: Self-pay | Admitting: Cardiology

## 2018-10-05 NOTE — Telephone Encounter (Signed)
Patient's daughter calling wanting the results of her recent test  Please call patient or daughter to discuss

## 2018-10-05 NOTE — Telephone Encounter (Signed)
Advised patient's daughter that the results were still in Dr. Wendy Poet box to look at, but her EF is normal. I let her know that I would have him review her results so we can call with a full report tomorrow. Patient's daughter Nicole Patton was understanding of this.

## 2018-11-23 ENCOUNTER — Ambulatory Visit: Payer: Medicare Other | Admitting: Cardiology

## 2019-06-14 DIAGNOSIS — M1712 Unilateral primary osteoarthritis, left knee: Secondary | ICD-10-CM | POA: Diagnosis not present

## 2019-07-14 DIAGNOSIS — N390 Urinary tract infection, site not specified: Secondary | ICD-10-CM | POA: Diagnosis not present

## 2019-07-14 DIAGNOSIS — E119 Type 2 diabetes mellitus without complications: Secondary | ICD-10-CM | POA: Diagnosis not present

## 2019-08-06 DIAGNOSIS — R350 Frequency of micturition: Secondary | ICD-10-CM | POA: Diagnosis not present

## 2019-08-06 DIAGNOSIS — R300 Dysuria: Secondary | ICD-10-CM | POA: Diagnosis not present

## 2019-08-06 DIAGNOSIS — G3184 Mild cognitive impairment, so stated: Secondary | ICD-10-CM | POA: Diagnosis not present

## 2019-09-01 DIAGNOSIS — N390 Urinary tract infection, site not specified: Secondary | ICD-10-CM | POA: Diagnosis not present

## 2019-09-01 DIAGNOSIS — N3281 Overactive bladder: Secondary | ICD-10-CM | POA: Diagnosis not present

## 2019-09-01 DIAGNOSIS — Z79899 Other long term (current) drug therapy: Secondary | ICD-10-CM | POA: Diagnosis not present

## 2019-09-02 DIAGNOSIS — R300 Dysuria: Secondary | ICD-10-CM | POA: Diagnosis not present

## 2019-09-02 DIAGNOSIS — R609 Edema, unspecified: Secondary | ICD-10-CM | POA: Diagnosis not present

## 2019-09-02 DIAGNOSIS — B351 Tinea unguium: Secondary | ICD-10-CM | POA: Diagnosis not present

## 2019-09-02 DIAGNOSIS — E119 Type 2 diabetes mellitus without complications: Secondary | ICD-10-CM | POA: Diagnosis not present

## 2019-09-02 DIAGNOSIS — M2041 Other hammer toe(s) (acquired), right foot: Secondary | ICD-10-CM | POA: Diagnosis not present

## 2019-09-28 DIAGNOSIS — M1712 Unilateral primary osteoarthritis, left knee: Secondary | ICD-10-CM | POA: Diagnosis not present

## 2019-09-28 DIAGNOSIS — M25069 Hemarthrosis, unspecified knee: Secondary | ICD-10-CM | POA: Diagnosis not present

## 2019-10-01 DIAGNOSIS — M25562 Pain in left knee: Secondary | ICD-10-CM | POA: Diagnosis not present

## 2019-10-01 DIAGNOSIS — M1712 Unilateral primary osteoarthritis, left knee: Secondary | ICD-10-CM | POA: Diagnosis not present

## 2019-10-05 DIAGNOSIS — M1712 Unilateral primary osteoarthritis, left knee: Secondary | ICD-10-CM | POA: Diagnosis not present

## 2019-10-13 DIAGNOSIS — R509 Fever, unspecified: Secondary | ICD-10-CM | POA: Diagnosis not present

## 2019-10-13 DIAGNOSIS — D509 Iron deficiency anemia, unspecified: Secondary | ICD-10-CM | POA: Diagnosis not present

## 2019-10-13 DIAGNOSIS — U071 COVID-19: Secondary | ICD-10-CM | POA: Diagnosis not present

## 2019-10-13 DIAGNOSIS — Z8579 Personal history of other malignant neoplasms of lymphoid, hematopoietic and related tissues: Secondary | ICD-10-CM | POA: Diagnosis not present

## 2019-10-13 DIAGNOSIS — I672 Cerebral atherosclerosis: Secondary | ICD-10-CM | POA: Diagnosis not present

## 2019-10-13 DIAGNOSIS — R05 Cough: Secondary | ICD-10-CM | POA: Diagnosis not present

## 2019-10-14 DIAGNOSIS — U071 COVID-19: Secondary | ICD-10-CM | POA: Diagnosis not present

## 2019-11-03 DIAGNOSIS — M1712 Unilateral primary osteoarthritis, left knee: Secondary | ICD-10-CM | POA: Diagnosis not present

## 2019-12-02 DIAGNOSIS — M2041 Other hammer toe(s) (acquired), right foot: Secondary | ICD-10-CM | POA: Diagnosis not present

## 2019-12-02 DIAGNOSIS — E119 Type 2 diabetes mellitus without complications: Secondary | ICD-10-CM | POA: Diagnosis not present

## 2019-12-02 DIAGNOSIS — B351 Tinea unguium: Secondary | ICD-10-CM | POA: Diagnosis not present

## 2019-12-02 DIAGNOSIS — R2689 Other abnormalities of gait and mobility: Secondary | ICD-10-CM | POA: Diagnosis not present

## 2019-12-13 DIAGNOSIS — R2681 Unsteadiness on feet: Secondary | ICD-10-CM | POA: Diagnosis not present

## 2019-12-13 DIAGNOSIS — R531 Weakness: Secondary | ICD-10-CM | POA: Diagnosis not present

## 2019-12-13 DIAGNOSIS — R269 Unspecified abnormalities of gait and mobility: Secondary | ICD-10-CM | POA: Diagnosis not present

## 2019-12-16 DIAGNOSIS — M62572 Muscle wasting and atrophy, not elsewhere classified, left ankle and foot: Secondary | ICD-10-CM | POA: Diagnosis not present

## 2019-12-16 DIAGNOSIS — R269 Unspecified abnormalities of gait and mobility: Secondary | ICD-10-CM | POA: Diagnosis not present

## 2019-12-16 DIAGNOSIS — R531 Weakness: Secondary | ICD-10-CM | POA: Diagnosis not present

## 2019-12-16 DIAGNOSIS — M62571 Muscle wasting and atrophy, not elsewhere classified, right ankle and foot: Secondary | ICD-10-CM | POA: Diagnosis not present

## 2019-12-16 DIAGNOSIS — R2681 Unsteadiness on feet: Secondary | ICD-10-CM | POA: Diagnosis not present

## 2019-12-20 DIAGNOSIS — R531 Weakness: Secondary | ICD-10-CM | POA: Diagnosis not present

## 2019-12-20 DIAGNOSIS — R2681 Unsteadiness on feet: Secondary | ICD-10-CM | POA: Diagnosis not present

## 2019-12-20 DIAGNOSIS — R269 Unspecified abnormalities of gait and mobility: Secondary | ICD-10-CM | POA: Diagnosis not present

## 2019-12-23 DIAGNOSIS — R269 Unspecified abnormalities of gait and mobility: Secondary | ICD-10-CM | POA: Diagnosis not present

## 2019-12-23 DIAGNOSIS — R531 Weakness: Secondary | ICD-10-CM | POA: Diagnosis not present

## 2019-12-23 DIAGNOSIS — R2681 Unsteadiness on feet: Secondary | ICD-10-CM | POA: Diagnosis not present

## 2019-12-28 DIAGNOSIS — N390 Urinary tract infection, site not specified: Secondary | ICD-10-CM | POA: Diagnosis not present

## 2019-12-28 DIAGNOSIS — N3281 Overactive bladder: Secondary | ICD-10-CM | POA: Diagnosis not present

## 2019-12-29 DIAGNOSIS — R531 Weakness: Secondary | ICD-10-CM | POA: Diagnosis not present

## 2019-12-29 DIAGNOSIS — R269 Unspecified abnormalities of gait and mobility: Secondary | ICD-10-CM | POA: Diagnosis not present

## 2019-12-29 DIAGNOSIS — R2681 Unsteadiness on feet: Secondary | ICD-10-CM | POA: Diagnosis not present

## 2019-12-31 DIAGNOSIS — R269 Unspecified abnormalities of gait and mobility: Secondary | ICD-10-CM | POA: Diagnosis not present

## 2019-12-31 DIAGNOSIS — R2681 Unsteadiness on feet: Secondary | ICD-10-CM | POA: Diagnosis not present

## 2019-12-31 DIAGNOSIS — R531 Weakness: Secondary | ICD-10-CM | POA: Diagnosis not present

## 2020-01-04 DIAGNOSIS — R2681 Unsteadiness on feet: Secondary | ICD-10-CM | POA: Diagnosis not present

## 2020-01-04 DIAGNOSIS — R531 Weakness: Secondary | ICD-10-CM | POA: Diagnosis not present

## 2020-01-04 DIAGNOSIS — R269 Unspecified abnormalities of gait and mobility: Secondary | ICD-10-CM | POA: Diagnosis not present

## 2020-01-07 DIAGNOSIS — R269 Unspecified abnormalities of gait and mobility: Secondary | ICD-10-CM | POA: Diagnosis not present

## 2020-01-07 DIAGNOSIS — R2681 Unsteadiness on feet: Secondary | ICD-10-CM | POA: Diagnosis not present

## 2020-01-07 DIAGNOSIS — R531 Weakness: Secondary | ICD-10-CM | POA: Diagnosis not present

## 2020-01-11 DIAGNOSIS — R531 Weakness: Secondary | ICD-10-CM | POA: Diagnosis not present

## 2020-01-11 DIAGNOSIS — R269 Unspecified abnormalities of gait and mobility: Secondary | ICD-10-CM | POA: Diagnosis not present

## 2020-01-11 DIAGNOSIS — R2681 Unsteadiness on feet: Secondary | ICD-10-CM | POA: Diagnosis not present

## 2020-01-13 DIAGNOSIS — R531 Weakness: Secondary | ICD-10-CM | POA: Diagnosis not present

## 2020-01-13 DIAGNOSIS — R2681 Unsteadiness on feet: Secondary | ICD-10-CM | POA: Diagnosis not present

## 2020-01-13 DIAGNOSIS — R269 Unspecified abnormalities of gait and mobility: Secondary | ICD-10-CM | POA: Diagnosis not present

## 2020-01-15 DIAGNOSIS — M62571 Muscle wasting and atrophy, not elsewhere classified, right ankle and foot: Secondary | ICD-10-CM | POA: Diagnosis not present

## 2020-01-15 DIAGNOSIS — M62572 Muscle wasting and atrophy, not elsewhere classified, left ankle and foot: Secondary | ICD-10-CM | POA: Diagnosis not present

## 2020-01-19 DIAGNOSIS — R2681 Unsteadiness on feet: Secondary | ICD-10-CM | POA: Diagnosis not present

## 2020-01-19 DIAGNOSIS — R531 Weakness: Secondary | ICD-10-CM | POA: Diagnosis not present

## 2020-01-19 DIAGNOSIS — R269 Unspecified abnormalities of gait and mobility: Secondary | ICD-10-CM | POA: Diagnosis not present

## 2020-01-20 DIAGNOSIS — R531 Weakness: Secondary | ICD-10-CM | POA: Diagnosis not present

## 2020-01-20 DIAGNOSIS — R269 Unspecified abnormalities of gait and mobility: Secondary | ICD-10-CM | POA: Diagnosis not present

## 2020-01-20 DIAGNOSIS — R2681 Unsteadiness on feet: Secondary | ICD-10-CM | POA: Diagnosis not present

## 2020-01-25 DIAGNOSIS — R2681 Unsteadiness on feet: Secondary | ICD-10-CM | POA: Diagnosis not present

## 2020-01-25 DIAGNOSIS — R531 Weakness: Secondary | ICD-10-CM | POA: Diagnosis not present

## 2020-01-25 DIAGNOSIS — R269 Unspecified abnormalities of gait and mobility: Secondary | ICD-10-CM | POA: Diagnosis not present

## 2020-01-27 DIAGNOSIS — R269 Unspecified abnormalities of gait and mobility: Secondary | ICD-10-CM | POA: Diagnosis not present

## 2020-01-27 DIAGNOSIS — R2681 Unsteadiness on feet: Secondary | ICD-10-CM | POA: Diagnosis not present

## 2020-01-27 DIAGNOSIS — R531 Weakness: Secondary | ICD-10-CM | POA: Diagnosis not present

## 2020-02-01 DIAGNOSIS — R531 Weakness: Secondary | ICD-10-CM | POA: Diagnosis not present

## 2020-02-01 DIAGNOSIS — R269 Unspecified abnormalities of gait and mobility: Secondary | ICD-10-CM | POA: Diagnosis not present

## 2020-02-01 DIAGNOSIS — R2681 Unsteadiness on feet: Secondary | ICD-10-CM | POA: Diagnosis not present

## 2020-02-08 DIAGNOSIS — R531 Weakness: Secondary | ICD-10-CM | POA: Diagnosis not present

## 2020-02-08 DIAGNOSIS — R2681 Unsteadiness on feet: Secondary | ICD-10-CM | POA: Diagnosis not present

## 2020-02-08 DIAGNOSIS — R269 Unspecified abnormalities of gait and mobility: Secondary | ICD-10-CM | POA: Diagnosis not present

## 2020-02-15 DIAGNOSIS — M62572 Muscle wasting and atrophy, not elsewhere classified, left ankle and foot: Secondary | ICD-10-CM | POA: Diagnosis not present

## 2020-02-15 DIAGNOSIS — M62571 Muscle wasting and atrophy, not elsewhere classified, right ankle and foot: Secondary | ICD-10-CM | POA: Diagnosis not present

## 2020-03-08 DIAGNOSIS — M2041 Other hammer toe(s) (acquired), right foot: Secondary | ICD-10-CM | POA: Diagnosis not present

## 2020-03-08 DIAGNOSIS — N390 Urinary tract infection, site not specified: Secondary | ICD-10-CM | POA: Diagnosis not present

## 2020-03-08 DIAGNOSIS — B351 Tinea unguium: Secondary | ICD-10-CM | POA: Diagnosis not present

## 2020-03-10 DIAGNOSIS — K219 Gastro-esophageal reflux disease without esophagitis: Secondary | ICD-10-CM | POA: Diagnosis not present

## 2020-03-10 DIAGNOSIS — Z0001 Encounter for general adult medical examination with abnormal findings: Secondary | ICD-10-CM | POA: Diagnosis not present

## 2020-03-10 DIAGNOSIS — G3184 Mild cognitive impairment, so stated: Secondary | ICD-10-CM | POA: Diagnosis not present

## 2020-03-10 DIAGNOSIS — R5383 Other fatigue: Secondary | ICD-10-CM | POA: Diagnosis not present

## 2020-03-10 DIAGNOSIS — E785 Hyperlipidemia, unspecified: Secondary | ICD-10-CM | POA: Diagnosis not present

## 2020-03-10 DIAGNOSIS — Z79899 Other long term (current) drug therapy: Secondary | ICD-10-CM | POA: Diagnosis not present

## 2020-03-10 DIAGNOSIS — Z23 Encounter for immunization: Secondary | ICD-10-CM | POA: Diagnosis not present

## 2020-03-10 DIAGNOSIS — E559 Vitamin D deficiency, unspecified: Secondary | ICD-10-CM | POA: Diagnosis not present

## 2020-03-10 DIAGNOSIS — J449 Chronic obstructive pulmonary disease, unspecified: Secondary | ICD-10-CM | POA: Diagnosis not present

## 2020-03-10 DIAGNOSIS — E119 Type 2 diabetes mellitus without complications: Secondary | ICD-10-CM | POA: Diagnosis not present

## 2020-03-17 DIAGNOSIS — M62571 Muscle wasting and atrophy, not elsewhere classified, right ankle and foot: Secondary | ICD-10-CM | POA: Diagnosis not present

## 2020-03-17 DIAGNOSIS — M62572 Muscle wasting and atrophy, not elsewhere classified, left ankle and foot: Secondary | ICD-10-CM | POA: Diagnosis not present

## 2020-03-21 DIAGNOSIS — M79605 Pain in left leg: Secondary | ICD-10-CM | POA: Diagnosis not present

## 2020-03-21 DIAGNOSIS — R531 Weakness: Secondary | ICD-10-CM | POA: Diagnosis not present

## 2020-03-21 DIAGNOSIS — M25562 Pain in left knee: Secondary | ICD-10-CM | POA: Diagnosis not present

## 2020-04-04 DIAGNOSIS — R300 Dysuria: Secondary | ICD-10-CM | POA: Diagnosis not present

## 2020-04-08 IMAGING — MR MRI HEAD WITHOUT AND WITH CONTRAST
12 series · 48 of 48 positions shown · IV contrast (multihance)
Comparison: MRI 11/24/2017, 09/03/2017, 08/29/2017 CT 08/28/2017.

CLINICAL DATA: Falling. Previous stroke. Right parietal cavernoma
seen previously.

Creatinine was obtained on site at [HOSPITAL] at [HOSPITAL].
Results: Creatinine 0.8 mg/dL.
EXAM:
MRI HEAD WITHOUT AND WITH CONTRAST
TECHNIQUE: Multiplanar, multiecho pulse sequences of the brain and surrounding
structures were obtained without and with intravenous contrast.
CONTRAST:  20mL MULTIHANCE GADOBENATE DIMEGLUMINE 529 MG/ML IV SOLN

[Series 2: t1_se_sag · sagittal · 5.0mm · 0.45mm/px · 1 of 21 slices shown]
[im 1/21]
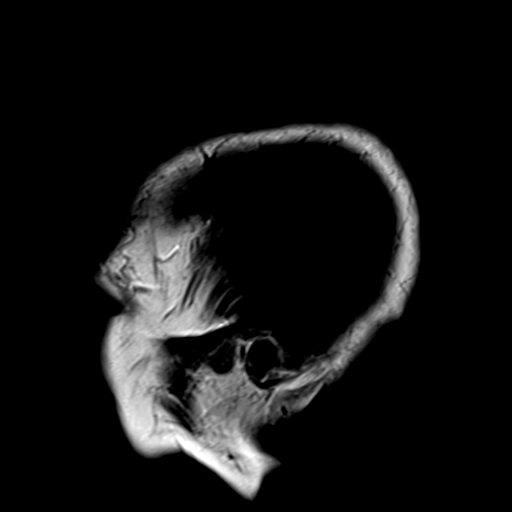

[Series 3: ep2d_diff_(id)_trace · axial · 3.0mm · 1.80mm/px · z∈[-65,+85]mm · 7 of 101 slices shown]
[im 1/101]
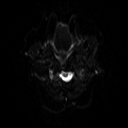
[im 17/101]
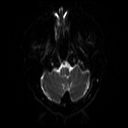
[im 34/101]
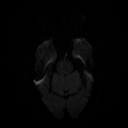
[im 51/101]
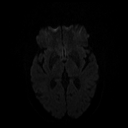
[im 67/101]
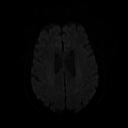
[im 84/101]
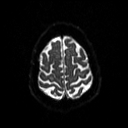
[im 101/101]
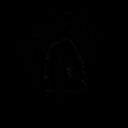

[Series 4: ep2d_diff_(id)_trace_adc · axial · 3.0mm · 1.80mm/px · z∈[-65,+85]mm · 3 of 51 slices shown]
[im 1/51]
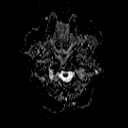
[im 26/51]
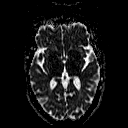
[im 51/51]
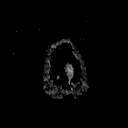

[Series 5: ep2d_diff_cor · coronal · 5.0mm · 1.77mm/px · 4 of 54 slices shown]
[im 1/54]
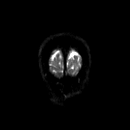
[im 18/54]
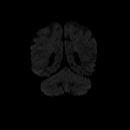
[im 36/54]
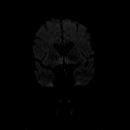
[im 54/54]
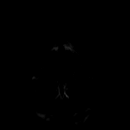

[Series 6: ep2d_diff_cor_adc · coronal · 5.0mm · 1.77mm/px · 2 of 27 slices shown]
[im 1/27]
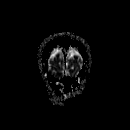
[im 27/27]
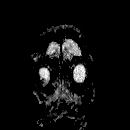

[Series 7: FLAIR · axial · 3.0mm · 0.45mm/px · z∈[-70,+86]mm · 2 of 27 slices shown]
[im 1/27]
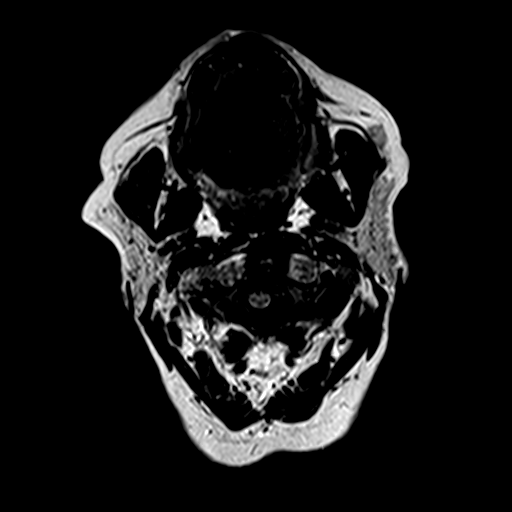
[im 27/27]
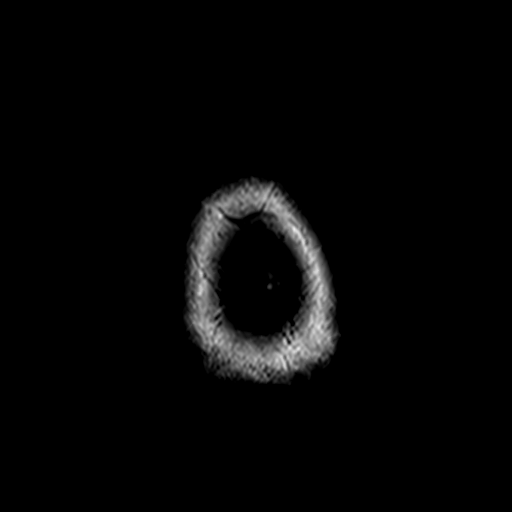

[Series 8: t2_tse_tra · axial · 5.0mm · 0.72mm/px · z∈[-66,+83]mm · 2 of 26 slices shown]
[im 1/26]
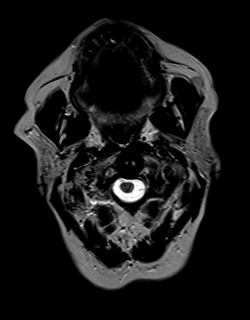
[im 26/26]
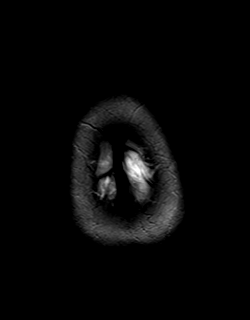

[Series 10: swi_images · axial · 2.0mm · 0.90mm/px · z∈[-70,+87]mm · 5 of 80 slices shown]
[im 1/80]
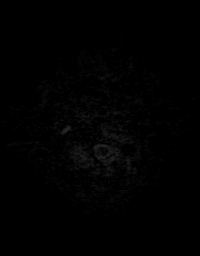
[im 20/80]
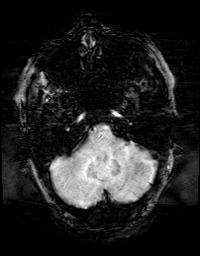
[im 40/80]
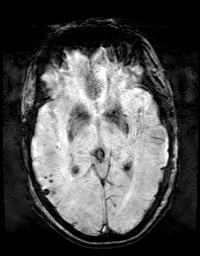
[im 60/80]
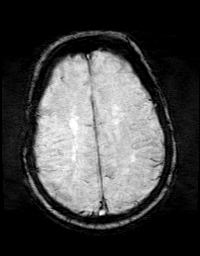
[im 80/80]
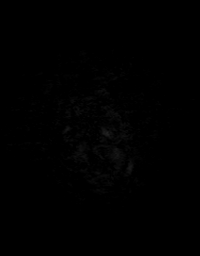

[Series 11: t1_mpr_tra · axial · 1.0mm · 0.72mm/px · z∈[-63,+80]mm · 9 of 144 slices shown]
[im 1/144]
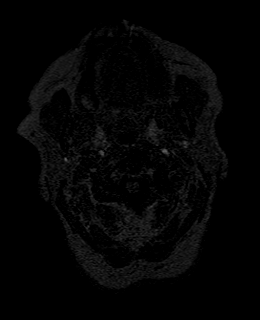
[im 18/144]
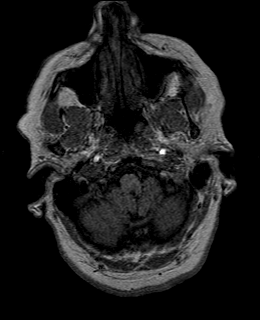
[im 36/144]
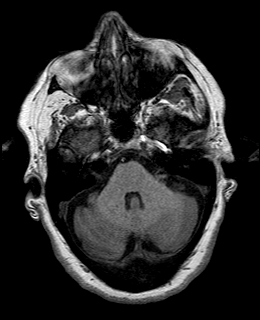
[im 54/144]
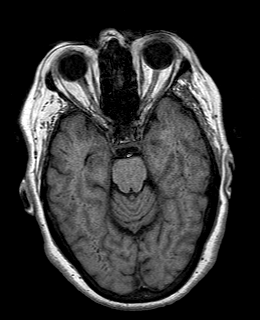
[im 72/144]
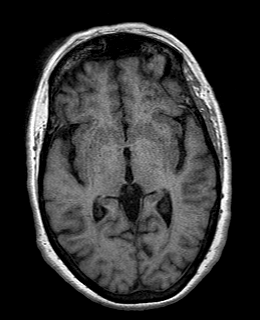
[im 90/144]
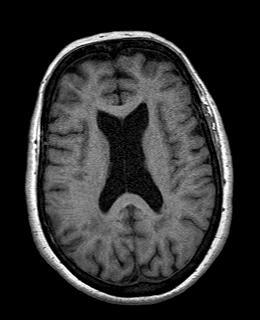
[im 108/144]
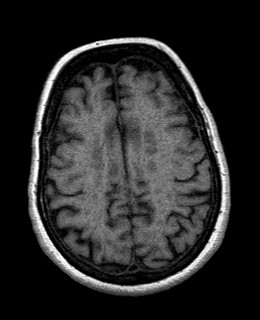
[im 126/144]
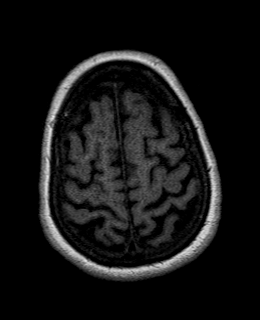
[im 144/144]
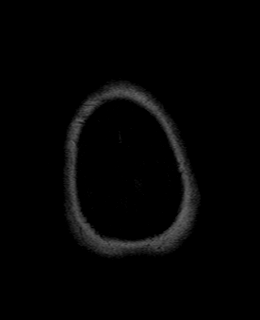

[Series 12: T2 post-contrast · coronal · 5.0mm · 0.45mm/px · 2 of 30 slices shown]
[im 1/30]
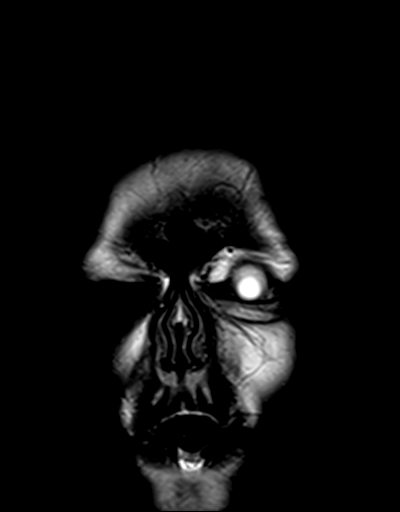
[im 30/30]
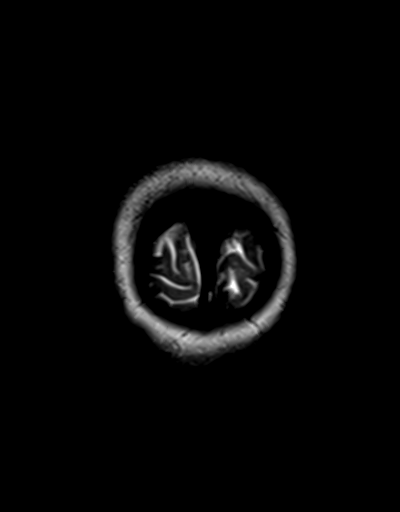

[Series 13: post t1_mpr_tra · axial · 1.0mm · 0.72mm/px · z∈[-63,+80]mm · 9 of 144 slices shown]
[im 1/144]
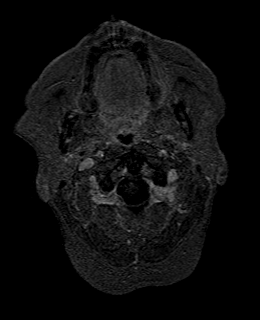
[im 18/144]
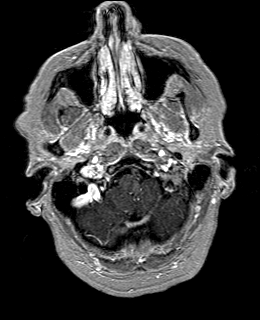
[im 36/144]
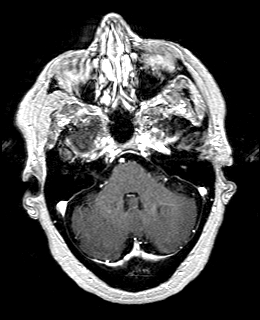
[im 54/144]
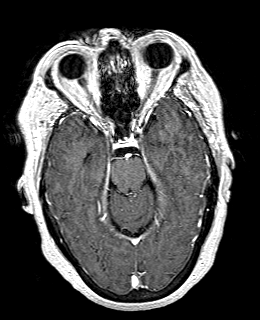
[im 72/144]
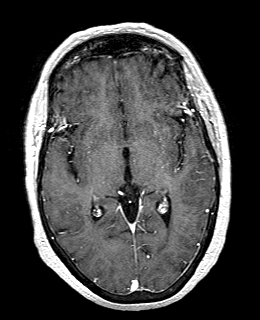
[im 90/144]
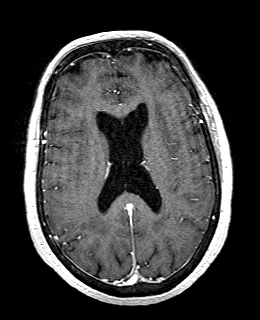
[im 108/144]
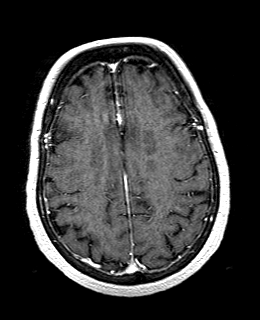
[im 126/144]
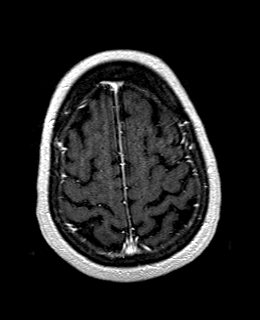
[im 144/144]
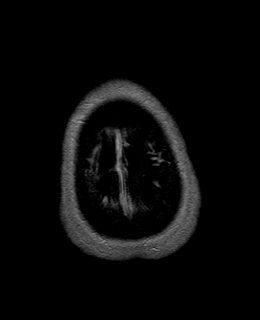

[Series 14: T1 post-contrast · coronal · 5.0mm · 0.72mm/px · 2 of 30 slices shown]
[im 1/30]
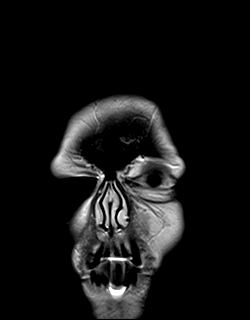
[im 30/30]
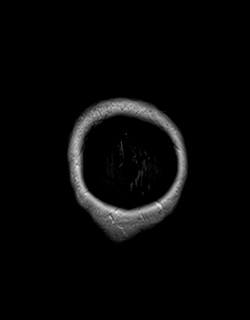

[48 of 48 positions shown; findings below may reference images not displayed]

FINDINGS: Brain: Diffusion imaging does not show any acute or subacute
infarction. The brainstem and cerebellum are normal. Cerebral
hemispheres show moderate changes of chronic small vessel disease
throughout the deep and subcortical white matter, similar to the
previous studies. There is an enhancing lesion with hemosiderin
deposition along the surface of the brain in the right inferior
parietal region likely represent a developmental venous anomaly with
multiple subtypes, prominently cavernous angioma. Susceptibility
weighted imaging has become more sensitive over time, and we can see
other punctate foci of hemosiderin deposition throughout both
cerebral hemispheres, likely to represent tiny
cavernomas/developmental venous anomalies too small to see on the
previous exams. No evidence of neoplastic mass lesion, acute
hemorrhage, hydrocephalus or extra-axial collection.

Vascular: Major vessels at the base of the brain show flow.

Skull and upper cervical spine: Negative

Sinuses/Orbits: Clear/normal

Other: None
IMPRESSION: No acute finding by MRI. Moderate chronic small-vessel ischemic
changes affecting the cerebral hemispheric white matter.

Approximately 10-12 mm vascular lesion with hemosiderin deposition
along the surface of the brain in the right parietal region which
has not visibly changed since the studies of last [REDACTED]. This is
consistent with a mixed developmental venous anomaly, with prominent
cavernous component. Other tiny foci of susceptibility artifact
throughout the cerebral hemispheres likely represent other tiny
developmental venous anomalies with microhemorrhage, seen better on
today's study due to use of a more sensitive susceptibility weighted
technique.

## 2020-04-14 DIAGNOSIS — M62571 Muscle wasting and atrophy, not elsewhere classified, right ankle and foot: Secondary | ICD-10-CM | POA: Diagnosis not present

## 2020-04-14 DIAGNOSIS — M62572 Muscle wasting and atrophy, not elsewhere classified, left ankle and foot: Secondary | ICD-10-CM | POA: Diagnosis not present

## 2020-05-15 DIAGNOSIS — M62571 Muscle wasting and atrophy, not elsewhere classified, right ankle and foot: Secondary | ICD-10-CM | POA: Diagnosis not present

## 2020-05-15 DIAGNOSIS — M62572 Muscle wasting and atrophy, not elsewhere classified, left ankle and foot: Secondary | ICD-10-CM | POA: Diagnosis not present

## 2020-05-26 ENCOUNTER — Ambulatory Visit: Payer: Medicare Other | Admitting: Neurology

## 2020-05-31 DIAGNOSIS — N390 Urinary tract infection, site not specified: Secondary | ICD-10-CM | POA: Diagnosis not present

## 2020-06-13 DIAGNOSIS — N390 Urinary tract infection, site not specified: Secondary | ICD-10-CM | POA: Diagnosis not present

## 2020-06-14 DIAGNOSIS — M62571 Muscle wasting and atrophy, not elsewhere classified, right ankle and foot: Secondary | ICD-10-CM | POA: Diagnosis not present

## 2020-06-14 DIAGNOSIS — M62572 Muscle wasting and atrophy, not elsewhere classified, left ankle and foot: Secondary | ICD-10-CM | POA: Diagnosis not present

## 2020-06-29 DIAGNOSIS — R413 Other amnesia: Secondary | ICD-10-CM | POA: Diagnosis not present

## 2020-07-13 DIAGNOSIS — R9089 Other abnormal findings on diagnostic imaging of central nervous system: Secondary | ICD-10-CM | POA: Diagnosis not present

## 2020-07-15 DIAGNOSIS — M62571 Muscle wasting and atrophy, not elsewhere classified, right ankle and foot: Secondary | ICD-10-CM | POA: Diagnosis not present

## 2020-07-15 DIAGNOSIS — M62572 Muscle wasting and atrophy, not elsewhere classified, left ankle and foot: Secondary | ICD-10-CM | POA: Diagnosis not present

## 2020-07-17 DIAGNOSIS — N390 Urinary tract infection, site not specified: Secondary | ICD-10-CM | POA: Diagnosis not present

## 2020-07-17 DIAGNOSIS — N3281 Overactive bladder: Secondary | ICD-10-CM | POA: Diagnosis not present

## 2020-07-18 ENCOUNTER — Ambulatory Visit: Payer: Medicare Other | Admitting: Neurology

## 2020-07-24 DIAGNOSIS — E119 Type 2 diabetes mellitus without complications: Secondary | ICD-10-CM | POA: Diagnosis not present

## 2020-07-24 DIAGNOSIS — E785 Hyperlipidemia, unspecified: Secondary | ICD-10-CM | POA: Diagnosis not present

## 2020-08-03 DIAGNOSIS — R413 Other amnesia: Secondary | ICD-10-CM | POA: Diagnosis not present

## 2020-08-03 DIAGNOSIS — I6789 Other cerebrovascular disease: Secondary | ICD-10-CM | POA: Diagnosis not present

## 2020-08-07 ENCOUNTER — Telehealth: Payer: Self-pay | Admitting: Cardiology

## 2020-08-07 DIAGNOSIS — I951 Orthostatic hypotension: Secondary | ICD-10-CM | POA: Diagnosis not present

## 2020-08-07 DIAGNOSIS — R413 Other amnesia: Secondary | ICD-10-CM | POA: Diagnosis not present

## 2020-08-07 NOTE — Telephone Encounter (Signed)
Called and spoke with Nicole Patton medical patient pcp. Patient is to be seen on 08/28/20 for a new patient appointment for dizziness, and syncopal episodes. PCP was wanting to know if we can get her a 24 hr blood pressure monitor before her appt. I advised that I do not think we can do anything like that until she is established with [place]. She understood she asked for a sooner appointment was able to get her a appoint for 08/18/20 instead. Will check with [facility] if they have any other recommendations.

## 2020-08-07 NOTE — Telephone Encounter (Signed)
STAT if patient feels like he/she is going to faint   Are you dizzy now? Yes   Do you feel faint or have you passed out? yes  Do you have any other symptoms? Passing out   Have you checked your HR and BP (record if available)?     [facility] patient sis there now. They also put heart monitor on the patient.

## 2020-08-08 DIAGNOSIS — N390 Urinary tract infection, site not specified: Secondary | ICD-10-CM | POA: Diagnosis not present

## 2020-08-14 DIAGNOSIS — M62571 Muscle wasting and atrophy, not elsewhere classified, right ankle and foot: Secondary | ICD-10-CM | POA: Diagnosis not present

## 2020-08-14 DIAGNOSIS — M62572 Muscle wasting and atrophy, not elsewhere classified, left ankle and foot: Secondary | ICD-10-CM | POA: Diagnosis not present

## 2020-08-17 ENCOUNTER — Encounter: Payer: Self-pay | Admitting: *Deleted

## 2020-08-17 NOTE — Progress Notes (Addendum)
Cardiology Office Note:    Date:  08/18/2020   ID:  Nicole Patton, DOB [birth year], MRN [id number]  PCP:  Nicole Riches, NP  Cardiologist:  Nicole More, MD   Referring MD: Nicole Riches, NP  ASSESSMENT:    1. Syncope and collapse    PLAN:    In order of problems listed above:  Is a very complex and difficult issue fortunately her sister is present provides most of the information.  From her description her spells do not involve loss of consciousness uncertain etiology she suspects it may be related to anxiety being outside of the home.  I would like to review her monitor when it comes back I think it would give helpful information.  She has an increased risk of structural heart disease with chemotherapy for lymphoma long-term survivor and we will repeat echocardiogram.  Previously we had done ambulatory blood pressure monitors in our office and if I am able to all apply once a day but I think is highly unlikely that her problem is intermittent hypertension. 08/28/2019: I reviewed the event monitor from her primary care physician sinus rhythm no atrial fibrillation rare ventricular and supraventricular ectopy and no episodes of second or third-degree AV block or sinus node pauses.  There were brief runs of atrial premature contractions.  Next appointment 4 to 6 weeks   Medication Adjustments/Labs and Tests Ordered: Current medicines are reviewed at length with the patient today.  Concerns regarding medicines are outlined above.  Orders Placed This Encounter  Procedures   EKG 12-Lead    No orders of the defined types were placed in this encounter.    Chief Complaint  Patient presents with   Loss of Consciousness  Her sister relates she has spells where she is incoherent and weak when they try to go outside of the home.  Labile blood pressure  History of Present Illness:    Nicole Patton is a 75 y.o. female who is being seen today for the evaluation of syncope at the  request of York, Regina F, NP.  She has a history of cerebral micro vasculopathy and memory loss seen by neurology [facility] 08/04/2018.  MRI of the brain 07/13/2020 showed no acute abnormality and nonspecific findings consistent with chronic microvascular disease  She was seen by my partner Dr. Agustin Patton 09/28/1998 for chest pain.  She has a history of dyslipidemia and previous cigarette smoking as well as lymphoma with chemotherapy.  Echocardiogram was performed 10/02/2018 showing normal left ventricular size systolic function EF 60 to 65% and normal diastolic function.  The right ventricle is normal in size and function.  There is mild mitral annular calcification moderate aortic valve sclerosis without stenosis.  She had a myocardial perfusion study performed pharmacologically 09/28/2018 there is no ischemic ST segment abnormality no arrhythmia blood pressure response was normal left ventricular function is normal 65% with normal perfusion, normal test.  Her sister is present provides most of the information. She tells me she wore an ambulatory event monitor through her primary care physician's office taken off Monday we will send a note requesting results I doubt its been processed. She tells me that her neurologist told her that she needed ambulatory blood pressure monitor and that he was worried she was having intermittent severe hypertension. Family tells me that they often get systolic blood pressures less than 90 at home she has no history of hypertension She has episodes they describe his spells.  She does not lose  consciousness.  They always happen when she is outside of her home they recover when she comes back into the house last about 15 minutes she becomes incoherent weak but does not lose consciousness. She has no complaints of chest pain shortness of breath palpitation or syncope. She has a history of a lymphoma with previous chemotherapy and in the timeframe of 2006 records are  not available in the chart. Past Medical History:  Diagnosis Date   Atypical chest pain 09/28/2018   Closed nondisplaced fracture of head of left radius with routine healing 02/17/2014   Dyslipidemia 09/28/2018   Dyspnea on exertion 09/28/2018   Ingrowing nail 06/25/2017   Left elbow pain 01/24/2014   Lesion of brain 09/29/2017   Pain in joint of left shoulder 01/24/2014   S/P rotator cuff repair 04/25/2014    Past Surgical History:  Procedure Laterality Date   APPENDECTOMY  2020   BREAST BIOPSY     X2   TUBAL LIGATION      Current Medications: Current Meds  Medication Sig   escitalopram (LEXAPRO) 5 MG tablet Take 5 mg by mouth daily.   glucosamine-chondroitin 500-400 MG tablet Take 1 tablet by mouth daily.   loratadine (CLARITIN) 10 MG tablet Take 10 mg by mouth daily as needed for allergies.   mirabegron ER (MYRBETRIQ) 25 MG TB24 tablet Take 25 mg by mouth daily.   Multiple Vitamin (MULTIVITAMIN) tablet Take 1 tablet by mouth daily.   omeprazole (PRILOSEC) 20 MG capsule Take 20 mg by mouth as needed (heartburn).     Allergies:   Codeine, Penicillin g, and Shellfish allergy   Social History   Socioeconomic History   Marital status: Divorced    Spouse name: Not on file   Number of children: Not on file   Years of education: Not on file   Highest education level: Not on file  Occupational History   Not on file  Tobacco Use   Smoking status: Former    Pack years: 0.00   Smokeless tobacco: Never  Substance and Sexual Activity   Alcohol use: Not on file    Comment: only on special occasions   Drug use: Never   Sexual activity: Not on file  Other Topics Concern   Not on file  Social History Narrative   Pt lives alone in 2 story home   Has 3 children   Received GED from [facility]   Retired Chief Operating Officer    Social Determinants of Radio broadcast assistant Strain: Not on file  Food Insecurity: Not on file  Transportation Needs: Not on file  Physical Activity: Not on file   Stress: Not on file  Social Connections: Not on file     Family History: The patient's family history includes Cancer in her father; Diabetes in her mother.  ROS:   ROS Please see the history of present illness.     All other systems reviewed and are negative.  EKGs/Labs/Other Studies Reviewed:    The following studies were reviewed today:   EKG:  EKG is  ordered today.  The ekg ordered today is personally reviewed and demonstrates sinus rhythm voltage criteria for LVH otherwise normal EKG    Physical Exam:    VS:  BP (!) 88/60 (BP Location: Left Arm, Patient Position: Sitting, Cuff Size: Normal)   Pulse 97   Ht 5\' 8"  (1.727 m)   Wt 232 lb (105.2 kg)   SpO2 94%   BMI 35.28 kg/m  Wt Readings from Last 3 Encounters:  08/18/20 232 lb (105.2 kg)  10/02/18 222 lb (100.7 kg)  09/28/18 222 lb 6.4 oz (100.9 kg)  I personally rechecked blood pressure both upper extremities 638/46 palpable systolic 659   GEN: She is very passive paucity of communication from her history obtained from her sister well nourished, well developed in no acute distress HEENT: Normal NECK: No JVD; No carotid bruits LYMPHATICS: No lymphadenopathy CARDIAC: RRR, no murmurs, rubs, gallops RESPIRATORY:  Clear to auscultation without rales, wheezing or rhonchi  ABDOMEN: Soft, non-tender, non-distended MUSCULOSKELETAL:  No edema; No deformity  SKIN: Warm and dry NEUROLOGIC:  Alert and oriented x 3 PSYCHIATRIC:  Normal affect     Signed, Nicole More, MD  08/18/2020 11:22 AM    [facility]

## 2020-08-18 ENCOUNTER — Other Ambulatory Visit: Payer: Self-pay

## 2020-08-18 ENCOUNTER — Ambulatory Visit (INDEPENDENT_AMBULATORY_CARE_PROVIDER_SITE_OTHER): Payer: Medicare Other | Admitting: Cardiology

## 2020-08-18 ENCOUNTER — Encounter: Payer: Self-pay | Admitting: Cardiology

## 2020-08-18 VITALS — BP 122/70 | HR 97 | Ht 68.0 in | Wt 232.0 lb

## 2020-08-18 DIAGNOSIS — R55 Syncope and collapse: Secondary | ICD-10-CM

## 2020-08-18 DIAGNOSIS — R0989 Other specified symptoms and signs involving the circulatory and respiratory systems: Secondary | ICD-10-CM

## 2020-08-18 NOTE — Patient Instructions (Signed)
Medication Instructions:  Your physician recommends that you continue on your current medications as directed. Please refer to the Current Medication list given to you today.  *If you need a refill on your cardiac medications before your next appointment, please call your pharmacy*   Lab Work: None If you have labs (blood work) drawn today and your tests are completely normal, you will receive your results only by: MyChart Message (if you have MyChart) OR A paper copy in the mail If you have any lab test that is abnormal or we need to change your treatment, we will call you to review the results.   Testing/Procedures: Your physician has requested that you have an echocardiogram. Echocardiography is a painless test that uses sound waves to create images of your heart. It provides your doctor with information about the size and shape of your heart and how well your heart's chambers and valves are working. This procedure takes approximately one hour. There are no restrictions for this procedure.    Follow-Up: At [facility], you and your health needs are our priority.  As part of our continuing mission to provide you with exceptional heart care, we have created designated Provider Care Teams.  These Care Teams include your primary Cardiologist (physician) and Advanced Practice Providers (APPs -  Physician Assistants and Nurse Practitioners) who all work together to provide you with the care you need, when you need it.  We recommend signing up for the patient portal called "MyChart".  Sign up information is provided on this After Visit Summary.  MyChart is used to connect with patients for Virtual Visits (Telemedicine).  Patients are able to view lab/test results, encounter notes, upcoming appointments, etc.  Non-urgent messages can be sent to your provider as well.   To learn more about what you can do with MyChart, go to [email or web address]://www.mychart.com.    Your next appointment:   6 week(s)  The  format for your next appointment:   In Person  Provider:   Brian Munley, MD   Other Instructions   

## 2020-08-21 ENCOUNTER — Ambulatory Visit: Payer: Medicare Other

## 2020-08-21 ENCOUNTER — Other Ambulatory Visit: Payer: Self-pay

## 2020-08-21 ENCOUNTER — Ambulatory Visit (INDEPENDENT_AMBULATORY_CARE_PROVIDER_SITE_OTHER): Payer: Medicare Other

## 2020-08-21 DIAGNOSIS — R55 Syncope and collapse: Secondary | ICD-10-CM

## 2020-08-21 DIAGNOSIS — R0989 Other specified symptoms and signs involving the circulatory and respiratory systems: Secondary | ICD-10-CM | POA: Diagnosis not present

## 2020-08-21 DIAGNOSIS — N390 Urinary tract infection, site not specified: Secondary | ICD-10-CM | POA: Diagnosis not present

## 2020-08-23 DIAGNOSIS — R531 Weakness: Secondary | ICD-10-CM | POA: Diagnosis not present

## 2020-08-23 DIAGNOSIS — E1165 Type 2 diabetes mellitus with hyperglycemia: Secondary | ICD-10-CM | POA: Diagnosis not present

## 2020-08-23 DIAGNOSIS — E559 Vitamin D deficiency, unspecified: Secondary | ICD-10-CM | POA: Diagnosis not present

## 2020-08-23 DIAGNOSIS — E785 Hyperlipidemia, unspecified: Secondary | ICD-10-CM | POA: Diagnosis not present

## 2020-08-23 DIAGNOSIS — E539 Vitamin B deficiency, unspecified: Secondary | ICD-10-CM | POA: Diagnosis not present

## 2020-08-23 DIAGNOSIS — R42 Dizziness and giddiness: Secondary | ICD-10-CM | POA: Diagnosis not present

## 2020-08-25 DIAGNOSIS — G9340 Encephalopathy, unspecified: Secondary | ICD-10-CM | POA: Diagnosis not present

## 2020-08-25 DIAGNOSIS — R410 Disorientation, unspecified: Secondary | ICD-10-CM | POA: Diagnosis not present

## 2020-08-25 DIAGNOSIS — I639 Cerebral infarction, unspecified: Secondary | ICD-10-CM | POA: Diagnosis not present

## 2020-08-25 DIAGNOSIS — R0602 Shortness of breath: Secondary | ICD-10-CM | POA: Diagnosis not present

## 2020-08-25 DIAGNOSIS — K449 Diaphragmatic hernia without obstruction or gangrene: Secondary | ICD-10-CM | POA: Diagnosis not present

## 2020-08-28 ENCOUNTER — Ambulatory Visit: Payer: Medicare Other | Admitting: Cardiology

## 2020-09-04 ENCOUNTER — Other Ambulatory Visit: Payer: Self-pay

## 2020-09-04 ENCOUNTER — Ambulatory Visit (INDEPENDENT_AMBULATORY_CARE_PROVIDER_SITE_OTHER): Payer: Medicare Other

## 2020-09-04 DIAGNOSIS — R55 Syncope and collapse: Secondary | ICD-10-CM

## 2020-09-04 DIAGNOSIS — R0989 Other specified symptoms and signs involving the circulatory and respiratory systems: Secondary | ICD-10-CM

## 2020-09-04 LAB — ECHOCARDIOGRAM COMPLETE
Area-P 1/2: 4.99 cm2
S' Lateral: 2.4 cm

## 2020-09-04 NOTE — Progress Notes (Signed)
Complete echocardiogram performed.  Jimmy Mackenzee Becvar RDCS, RVT  

## 2020-09-11 DIAGNOSIS — N390 Urinary tract infection, site not specified: Secondary | ICD-10-CM | POA: Diagnosis not present

## 2020-09-14 DIAGNOSIS — M62571 Muscle wasting and atrophy, not elsewhere classified, right ankle and foot: Secondary | ICD-10-CM | POA: Diagnosis not present

## 2020-09-14 DIAGNOSIS — M62572 Muscle wasting and atrophy, not elsewhere classified, left ankle and foot: Secondary | ICD-10-CM | POA: Diagnosis not present

## 2020-09-14 DIAGNOSIS — H905 Unspecified sensorineural hearing loss: Secondary | ICD-10-CM | POA: Diagnosis not present

## 2020-09-15 DIAGNOSIS — R413 Other amnesia: Secondary | ICD-10-CM | POA: Diagnosis not present

## 2020-09-15 DIAGNOSIS — I6789 Other cerebrovascular disease: Secondary | ICD-10-CM | POA: Diagnosis not present

## 2020-10-03 ENCOUNTER — Other Ambulatory Visit: Payer: Self-pay

## 2020-10-05 ENCOUNTER — Telehealth: Payer: Self-pay | Admitting: Cardiology

## 2020-10-05 DIAGNOSIS — I959 Hypotension, unspecified: Secondary | ICD-10-CM | POA: Diagnosis not present

## 2020-10-05 DIAGNOSIS — N390 Urinary tract infection, site not specified: Secondary | ICD-10-CM | POA: Diagnosis not present

## 2020-10-05 NOTE — Telephone Encounter (Signed)
Spoke with pt's daughter and advised Dr. Bettina Gavia was ok with her appointment being cancelled as her test were all normal. Nicole Patton verbalized understanding and had no additional questions.

## 2020-10-05 NOTE — Telephone Encounter (Signed)
Daughter is calling stating she does not want to carry her mother to the upcoming appointment if it is just to discuss the results already received for her Echo. Advised her it is a 6 week follow up scheduled at [facility] last appt with Dr. Bettina Gavia per when he suggested and it can be canceled if she felt it was unnecessary. Stated she doesn't know if it is unnecessary and wanted a call discussing it.

## 2020-10-06 ENCOUNTER — Ambulatory Visit: Payer: Medicare Other | Admitting: Cardiology

## 2020-10-15 DIAGNOSIS — M62572 Muscle wasting and atrophy, not elsewhere classified, left ankle and foot: Secondary | ICD-10-CM | POA: Diagnosis not present

## 2020-10-15 DIAGNOSIS — M62571 Muscle wasting and atrophy, not elsewhere classified, right ankle and foot: Secondary | ICD-10-CM | POA: Diagnosis not present

## 2020-11-09 DIAGNOSIS — R197 Diarrhea, unspecified: Secondary | ICD-10-CM | POA: Diagnosis not present

## 2020-11-09 DIAGNOSIS — E118 Type 2 diabetes mellitus with unspecified complications: Secondary | ICD-10-CM | POA: Diagnosis not present

## 2020-11-09 DIAGNOSIS — E785 Hyperlipidemia, unspecified: Secondary | ICD-10-CM | POA: Diagnosis not present

## 2020-11-09 DIAGNOSIS — E119 Type 2 diabetes mellitus without complications: Secondary | ICD-10-CM | POA: Diagnosis not present

## 2020-11-09 DIAGNOSIS — M25562 Pain in left knee: Secondary | ICD-10-CM | POA: Diagnosis not present

## 2020-11-14 DIAGNOSIS — M62572 Muscle wasting and atrophy, not elsewhere classified, left ankle and foot: Secondary | ICD-10-CM | POA: Diagnosis not present

## 2020-11-14 DIAGNOSIS — M62571 Muscle wasting and atrophy, not elsewhere classified, right ankle and foot: Secondary | ICD-10-CM | POA: Diagnosis not present

## 2020-11-22 DIAGNOSIS — R82998 Other abnormal findings in urine: Secondary | ICD-10-CM | POA: Diagnosis not present

## 2020-11-28 DIAGNOSIS — Z0001 Encounter for general adult medical examination with abnormal findings: Secondary | ICD-10-CM | POA: Diagnosis not present

## 2020-12-11 DIAGNOSIS — E785 Hyperlipidemia, unspecified: Secondary | ICD-10-CM | POA: Diagnosis not present

## 2020-12-11 DIAGNOSIS — J309 Allergic rhinitis, unspecified: Secondary | ICD-10-CM | POA: Diagnosis not present

## 2020-12-11 DIAGNOSIS — J449 Chronic obstructive pulmonary disease, unspecified: Secondary | ICD-10-CM | POA: Diagnosis not present

## 2020-12-11 DIAGNOSIS — R451 Restlessness and agitation: Secondary | ICD-10-CM | POA: Diagnosis not present

## 2020-12-11 DIAGNOSIS — N3281 Overactive bladder: Secondary | ICD-10-CM | POA: Diagnosis not present

## 2020-12-13 DIAGNOSIS — J449 Chronic obstructive pulmonary disease, unspecified: Secondary | ICD-10-CM | POA: Diagnosis not present

## 2020-12-13 DIAGNOSIS — Z1329 Encounter for screening for other suspected endocrine disorder: Secondary | ICD-10-CM | POA: Diagnosis not present

## 2020-12-13 DIAGNOSIS — R54 Age-related physical debility: Secondary | ICD-10-CM | POA: Diagnosis not present

## 2020-12-13 DIAGNOSIS — Z1321 Encounter for screening for nutritional disorder: Secondary | ICD-10-CM | POA: Diagnosis not present

## 2020-12-13 DIAGNOSIS — R269 Unspecified abnormalities of gait and mobility: Secondary | ICD-10-CM | POA: Diagnosis not present

## 2020-12-13 DIAGNOSIS — Z13820 Encounter for screening for osteoporosis: Secondary | ICD-10-CM | POA: Diagnosis not present

## 2020-12-13 DIAGNOSIS — E119 Type 2 diabetes mellitus without complications: Secondary | ICD-10-CM | POA: Diagnosis not present

## 2020-12-13 DIAGNOSIS — D509 Iron deficiency anemia, unspecified: Secondary | ICD-10-CM | POA: Diagnosis not present

## 2020-12-13 DIAGNOSIS — I10 Essential (primary) hypertension: Secondary | ICD-10-CM | POA: Diagnosis not present

## 2020-12-13 DIAGNOSIS — M6281 Muscle weakness (generalized): Secondary | ICD-10-CM | POA: Diagnosis not present

## 2020-12-15 DIAGNOSIS — M62572 Muscle wasting and atrophy, not elsewhere classified, left ankle and foot: Secondary | ICD-10-CM | POA: Diagnosis not present

## 2020-12-15 DIAGNOSIS — M62571 Muscle wasting and atrophy, not elsewhere classified, right ankle and foot: Secondary | ICD-10-CM | POA: Diagnosis not present

## 2020-12-19 DIAGNOSIS — R269 Unspecified abnormalities of gait and mobility: Secondary | ICD-10-CM | POA: Diagnosis not present

## 2020-12-19 DIAGNOSIS — M6281 Muscle weakness (generalized): Secondary | ICD-10-CM | POA: Diagnosis not present

## 2020-12-19 DIAGNOSIS — E785 Hyperlipidemia, unspecified: Secondary | ICD-10-CM | POA: Diagnosis not present

## 2020-12-19 DIAGNOSIS — J449 Chronic obstructive pulmonary disease, unspecified: Secondary | ICD-10-CM | POA: Diagnosis not present

## 2020-12-19 DIAGNOSIS — R54 Age-related physical debility: Secondary | ICD-10-CM | POA: Diagnosis not present

## 2020-12-20 DIAGNOSIS — N390 Urinary tract infection, site not specified: Secondary | ICD-10-CM | POA: Diagnosis not present

## 2020-12-20 DIAGNOSIS — R7303 Prediabetes: Secondary | ICD-10-CM | POA: Diagnosis not present

## 2020-12-20 DIAGNOSIS — D649 Anemia, unspecified: Secondary | ICD-10-CM | POA: Diagnosis not present

## 2020-12-20 DIAGNOSIS — R799 Abnormal finding of blood chemistry, unspecified: Secondary | ICD-10-CM | POA: Diagnosis not present

## 2020-12-22 DIAGNOSIS — M6281 Muscle weakness (generalized): Secondary | ICD-10-CM | POA: Diagnosis not present

## 2020-12-22 DIAGNOSIS — R269 Unspecified abnormalities of gait and mobility: Secondary | ICD-10-CM | POA: Diagnosis not present

## 2020-12-22 DIAGNOSIS — J449 Chronic obstructive pulmonary disease, unspecified: Secondary | ICD-10-CM | POA: Diagnosis not present

## 2020-12-22 DIAGNOSIS — R54 Age-related physical debility: Secondary | ICD-10-CM | POA: Diagnosis not present

## 2020-12-22 DIAGNOSIS — E785 Hyperlipidemia, unspecified: Secondary | ICD-10-CM | POA: Diagnosis not present

## 2020-12-25 DIAGNOSIS — R269 Unspecified abnormalities of gait and mobility: Secondary | ICD-10-CM | POA: Diagnosis not present

## 2020-12-25 DIAGNOSIS — J449 Chronic obstructive pulmonary disease, unspecified: Secondary | ICD-10-CM | POA: Diagnosis not present

## 2020-12-25 DIAGNOSIS — E785 Hyperlipidemia, unspecified: Secondary | ICD-10-CM | POA: Diagnosis not present

## 2020-12-25 DIAGNOSIS — M6281 Muscle weakness (generalized): Secondary | ICD-10-CM | POA: Diagnosis not present

## 2020-12-25 DIAGNOSIS — R54 Age-related physical debility: Secondary | ICD-10-CM | POA: Diagnosis not present

## 2020-12-27 DIAGNOSIS — J449 Chronic obstructive pulmonary disease, unspecified: Secondary | ICD-10-CM | POA: Diagnosis not present

## 2020-12-27 DIAGNOSIS — R54 Age-related physical debility: Secondary | ICD-10-CM | POA: Diagnosis not present

## 2020-12-27 DIAGNOSIS — M6281 Muscle weakness (generalized): Secondary | ICD-10-CM | POA: Diagnosis not present

## 2020-12-27 DIAGNOSIS — R269 Unspecified abnormalities of gait and mobility: Secondary | ICD-10-CM | POA: Diagnosis not present

## 2020-12-27 DIAGNOSIS — E785 Hyperlipidemia, unspecified: Secondary | ICD-10-CM | POA: Diagnosis not present

## 2021-01-01 DIAGNOSIS — R54 Age-related physical debility: Secondary | ICD-10-CM | POA: Diagnosis not present

## 2021-01-01 DIAGNOSIS — R269 Unspecified abnormalities of gait and mobility: Secondary | ICD-10-CM | POA: Diagnosis not present

## 2021-01-01 DIAGNOSIS — E785 Hyperlipidemia, unspecified: Secondary | ICD-10-CM | POA: Diagnosis not present

## 2021-01-01 DIAGNOSIS — J449 Chronic obstructive pulmonary disease, unspecified: Secondary | ICD-10-CM | POA: Diagnosis not present

## 2021-01-01 DIAGNOSIS — M6281 Muscle weakness (generalized): Secondary | ICD-10-CM | POA: Diagnosis not present

## 2021-01-03 DIAGNOSIS — J449 Chronic obstructive pulmonary disease, unspecified: Secondary | ICD-10-CM | POA: Diagnosis not present

## 2021-01-03 DIAGNOSIS — R54 Age-related physical debility: Secondary | ICD-10-CM | POA: Diagnosis not present

## 2021-01-03 DIAGNOSIS — E785 Hyperlipidemia, unspecified: Secondary | ICD-10-CM | POA: Diagnosis not present

## 2021-01-03 DIAGNOSIS — R269 Unspecified abnormalities of gait and mobility: Secondary | ICD-10-CM | POA: Diagnosis not present

## 2021-01-03 DIAGNOSIS — M6281 Muscle weakness (generalized): Secondary | ICD-10-CM | POA: Diagnosis not present

## 2021-01-08 DIAGNOSIS — J449 Chronic obstructive pulmonary disease, unspecified: Secondary | ICD-10-CM | POA: Diagnosis not present

## 2021-01-08 DIAGNOSIS — R269 Unspecified abnormalities of gait and mobility: Secondary | ICD-10-CM | POA: Diagnosis not present

## 2021-01-08 DIAGNOSIS — M6281 Muscle weakness (generalized): Secondary | ICD-10-CM | POA: Diagnosis not present

## 2021-01-08 DIAGNOSIS — E785 Hyperlipidemia, unspecified: Secondary | ICD-10-CM | POA: Diagnosis not present

## 2021-01-08 DIAGNOSIS — N390 Urinary tract infection, site not specified: Secondary | ICD-10-CM | POA: Diagnosis not present

## 2021-01-08 DIAGNOSIS — R54 Age-related physical debility: Secondary | ICD-10-CM | POA: Diagnosis not present

## 2021-01-10 DIAGNOSIS — M6281 Muscle weakness (generalized): Secondary | ICD-10-CM | POA: Diagnosis not present

## 2021-01-10 DIAGNOSIS — R54 Age-related physical debility: Secondary | ICD-10-CM | POA: Diagnosis not present

## 2021-01-10 DIAGNOSIS — R269 Unspecified abnormalities of gait and mobility: Secondary | ICD-10-CM | POA: Diagnosis not present

## 2021-01-10 DIAGNOSIS — E785 Hyperlipidemia, unspecified: Secondary | ICD-10-CM | POA: Diagnosis not present

## 2021-01-10 DIAGNOSIS — N390 Urinary tract infection, site not specified: Secondary | ICD-10-CM | POA: Diagnosis not present

## 2021-01-10 DIAGNOSIS — J449 Chronic obstructive pulmonary disease, unspecified: Secondary | ICD-10-CM | POA: Diagnosis not present

## 2021-01-10 DIAGNOSIS — B9562 Methicillin resistant Staphylococcus aureus infection as the cause of diseases classified elsewhere: Secondary | ICD-10-CM | POA: Diagnosis not present

## 2021-01-10 DIAGNOSIS — Z87440 Personal history of urinary (tract) infections: Secondary | ICD-10-CM | POA: Diagnosis not present

## 2021-01-16 DIAGNOSIS — J449 Chronic obstructive pulmonary disease, unspecified: Secondary | ICD-10-CM | POA: Diagnosis not present

## 2021-01-16 DIAGNOSIS — R269 Unspecified abnormalities of gait and mobility: Secondary | ICD-10-CM | POA: Diagnosis not present

## 2021-01-16 DIAGNOSIS — M6281 Muscle weakness (generalized): Secondary | ICD-10-CM | POA: Diagnosis not present

## 2021-01-16 DIAGNOSIS — E785 Hyperlipidemia, unspecified: Secondary | ICD-10-CM | POA: Diagnosis not present

## 2021-01-16 DIAGNOSIS — R54 Age-related physical debility: Secondary | ICD-10-CM | POA: Diagnosis not present

## 2021-01-19 DIAGNOSIS — E785 Hyperlipidemia, unspecified: Secondary | ICD-10-CM | POA: Diagnosis not present

## 2021-01-19 DIAGNOSIS — R54 Age-related physical debility: Secondary | ICD-10-CM | POA: Diagnosis not present

## 2021-01-19 DIAGNOSIS — J449 Chronic obstructive pulmonary disease, unspecified: Secondary | ICD-10-CM | POA: Diagnosis not present

## 2021-01-19 DIAGNOSIS — M6281 Muscle weakness (generalized): Secondary | ICD-10-CM | POA: Diagnosis not present

## 2021-01-19 DIAGNOSIS — R269 Unspecified abnormalities of gait and mobility: Secondary | ICD-10-CM | POA: Diagnosis not present

## 2021-01-23 DIAGNOSIS — R54 Age-related physical debility: Secondary | ICD-10-CM | POA: Diagnosis not present

## 2021-01-23 DIAGNOSIS — M6281 Muscle weakness (generalized): Secondary | ICD-10-CM | POA: Diagnosis not present

## 2021-01-23 DIAGNOSIS — R269 Unspecified abnormalities of gait and mobility: Secondary | ICD-10-CM | POA: Diagnosis not present

## 2021-01-23 DIAGNOSIS — E785 Hyperlipidemia, unspecified: Secondary | ICD-10-CM | POA: Diagnosis not present

## 2021-01-23 DIAGNOSIS — J449 Chronic obstructive pulmonary disease, unspecified: Secondary | ICD-10-CM | POA: Diagnosis not present

## 2021-01-29 DIAGNOSIS — Z87440 Personal history of urinary (tract) infections: Secondary | ICD-10-CM | POA: Diagnosis not present

## 2021-01-29 DIAGNOSIS — F03918 Unspecified dementia, unspecified severity, with other behavioral disturbance: Secondary | ICD-10-CM | POA: Diagnosis not present

## 2021-01-29 DIAGNOSIS — R42 Dizziness and giddiness: Secondary | ICD-10-CM | POA: Diagnosis not present

## 2021-01-29 DIAGNOSIS — I10 Essential (primary) hypertension: Secondary | ICD-10-CM | POA: Diagnosis not present

## 2021-01-29 DIAGNOSIS — N390 Urinary tract infection, site not specified: Secondary | ICD-10-CM | POA: Diagnosis not present

## 2021-01-30 DIAGNOSIS — R54 Age-related physical debility: Secondary | ICD-10-CM | POA: Diagnosis not present

## 2021-01-30 DIAGNOSIS — M6281 Muscle weakness (generalized): Secondary | ICD-10-CM | POA: Diagnosis not present

## 2021-01-30 DIAGNOSIS — E785 Hyperlipidemia, unspecified: Secondary | ICD-10-CM | POA: Diagnosis not present

## 2021-01-30 DIAGNOSIS — J449 Chronic obstructive pulmonary disease, unspecified: Secondary | ICD-10-CM | POA: Diagnosis not present

## 2021-01-30 DIAGNOSIS — R269 Unspecified abnormalities of gait and mobility: Secondary | ICD-10-CM | POA: Diagnosis not present

## 2021-02-07 DIAGNOSIS — U071 COVID-19: Secondary | ICD-10-CM | POA: Diagnosis not present

## 2021-02-07 DIAGNOSIS — J449 Chronic obstructive pulmonary disease, unspecified: Secondary | ICD-10-CM | POA: Diagnosis not present

## 2021-02-14 DIAGNOSIS — M62572 Muscle wasting and atrophy, not elsewhere classified, left ankle and foot: Secondary | ICD-10-CM | POA: Diagnosis not present

## 2021-02-14 DIAGNOSIS — M62571 Muscle wasting and atrophy, not elsewhere classified, right ankle and foot: Secondary | ICD-10-CM | POA: Diagnosis not present

## 2021-02-22 DIAGNOSIS — Z8616 Personal history of COVID-19: Secondary | ICD-10-CM | POA: Diagnosis not present

## 2021-02-22 DIAGNOSIS — Z87440 Personal history of urinary (tract) infections: Secondary | ICD-10-CM | POA: Diagnosis not present

## 2021-02-23 DIAGNOSIS — N390 Urinary tract infection, site not specified: Secondary | ICD-10-CM | POA: Diagnosis not present

## 2021-02-28 DIAGNOSIS — Z87440 Personal history of urinary (tract) infections: Secondary | ICD-10-CM | POA: Diagnosis not present

## 2021-02-28 DIAGNOSIS — N390 Urinary tract infection, site not specified: Secondary | ICD-10-CM | POA: Diagnosis not present

## 2021-03-13 DIAGNOSIS — B351 Tinea unguium: Secondary | ICD-10-CM | POA: Diagnosis not present

## 2021-03-13 DIAGNOSIS — E119 Type 2 diabetes mellitus without complications: Secondary | ICD-10-CM | POA: Diagnosis not present

## 2021-03-13 DIAGNOSIS — L609 Nail disorder, unspecified: Secondary | ICD-10-CM | POA: Diagnosis not present

## 2021-03-17 DIAGNOSIS — M62572 Muscle wasting and atrophy, not elsewhere classified, left ankle and foot: Secondary | ICD-10-CM | POA: Diagnosis not present

## 2021-03-17 DIAGNOSIS — M62571 Muscle wasting and atrophy, not elsewhere classified, right ankle and foot: Secondary | ICD-10-CM | POA: Diagnosis not present

## 2021-03-20 DIAGNOSIS — N390 Urinary tract infection, site not specified: Secondary | ICD-10-CM | POA: Diagnosis not present

## 2021-03-22 DIAGNOSIS — N390 Urinary tract infection, site not specified: Secondary | ICD-10-CM | POA: Diagnosis not present

## 2021-03-22 DIAGNOSIS — B9562 Methicillin resistant Staphylococcus aureus infection as the cause of diseases classified elsewhere: Secondary | ICD-10-CM | POA: Diagnosis not present

## 2021-03-22 DIAGNOSIS — Z87440 Personal history of urinary (tract) infections: Secondary | ICD-10-CM | POA: Diagnosis not present

## 2021-03-23 DIAGNOSIS — R451 Restlessness and agitation: Secondary | ICD-10-CM | POA: Diagnosis not present

## 2021-03-23 DIAGNOSIS — Z87440 Personal history of urinary (tract) infections: Secondary | ICD-10-CM | POA: Diagnosis not present

## 2021-03-26 DIAGNOSIS — N3281 Overactive bladder: Secondary | ICD-10-CM | POA: Diagnosis not present

## 2021-03-26 DIAGNOSIS — N390 Urinary tract infection, site not specified: Secondary | ICD-10-CM | POA: Diagnosis not present

## 2021-04-11 DIAGNOSIS — K2100 Gastro-esophageal reflux disease with esophagitis, without bleeding: Secondary | ICD-10-CM | POA: Diagnosis not present

## 2021-04-11 DIAGNOSIS — M1990 Unspecified osteoarthritis, unspecified site: Secondary | ICD-10-CM | POA: Diagnosis not present

## 2021-04-11 DIAGNOSIS — J449 Chronic obstructive pulmonary disease, unspecified: Secondary | ICD-10-CM | POA: Diagnosis not present

## 2021-04-11 DIAGNOSIS — E785 Hyperlipidemia, unspecified: Secondary | ICD-10-CM | POA: Diagnosis not present

## 2021-04-11 DIAGNOSIS — Z87440 Personal history of urinary (tract) infections: Secondary | ICD-10-CM | POA: Diagnosis not present

## 2021-04-12 DIAGNOSIS — E119 Type 2 diabetes mellitus without complications: Secondary | ICD-10-CM | POA: Diagnosis not present

## 2021-04-12 DIAGNOSIS — I10 Essential (primary) hypertension: Secondary | ICD-10-CM | POA: Diagnosis not present

## 2021-04-12 DIAGNOSIS — D649 Anemia, unspecified: Secondary | ICD-10-CM | POA: Diagnosis not present

## 2021-04-14 DIAGNOSIS — M62571 Muscle wasting and atrophy, not elsewhere classified, right ankle and foot: Secondary | ICD-10-CM | POA: Diagnosis not present

## 2021-04-14 DIAGNOSIS — M62572 Muscle wasting and atrophy, not elsewhere classified, left ankle and foot: Secondary | ICD-10-CM | POA: Diagnosis not present

## 2021-04-19 DIAGNOSIS — M6281 Muscle weakness (generalized): Secondary | ICD-10-CM | POA: Diagnosis not present

## 2021-04-19 DIAGNOSIS — M79671 Pain in right foot: Secondary | ICD-10-CM | POA: Diagnosis not present

## 2021-04-19 DIAGNOSIS — R269 Unspecified abnormalities of gait and mobility: Secondary | ICD-10-CM | POA: Diagnosis not present

## 2021-04-19 DIAGNOSIS — G894 Chronic pain syndrome: Secondary | ICD-10-CM | POA: Diagnosis not present

## 2021-05-09 DIAGNOSIS — N3281 Overactive bladder: Secondary | ICD-10-CM | POA: Diagnosis not present

## 2021-05-09 DIAGNOSIS — N390 Urinary tract infection, site not specified: Secondary | ICD-10-CM | POA: Diagnosis not present

## 2021-05-15 DIAGNOSIS — M62571 Muscle wasting and atrophy, not elsewhere classified, right ankle and foot: Secondary | ICD-10-CM | POA: Diagnosis not present

## 2021-05-15 DIAGNOSIS — M62572 Muscle wasting and atrophy, not elsewhere classified, left ankle and foot: Secondary | ICD-10-CM | POA: Diagnosis not present

## 2021-05-16 DIAGNOSIS — R52 Pain, unspecified: Secondary | ICD-10-CM | POA: Diagnosis not present

## 2021-05-16 DIAGNOSIS — G894 Chronic pain syndrome: Secondary | ICD-10-CM | POA: Diagnosis not present

## 2021-05-16 DIAGNOSIS — M1990 Unspecified osteoarthritis, unspecified site: Secondary | ICD-10-CM | POA: Diagnosis not present

## 2021-05-22 DIAGNOSIS — L609 Nail disorder, unspecified: Secondary | ICD-10-CM | POA: Diagnosis not present

## 2021-05-22 DIAGNOSIS — B351 Tinea unguium: Secondary | ICD-10-CM | POA: Diagnosis not present

## 2021-05-22 DIAGNOSIS — E119 Type 2 diabetes mellitus without complications: Secondary | ICD-10-CM | POA: Diagnosis not present

## 2021-05-26 DIAGNOSIS — I447 Left bundle-branch block, unspecified: Secondary | ICD-10-CM | POA: Diagnosis not present

## 2021-05-26 DIAGNOSIS — R079 Chest pain, unspecified: Secondary | ICD-10-CM | POA: Diagnosis not present

## 2021-05-26 DIAGNOSIS — R9431 Abnormal electrocardiogram [ECG] [EKG]: Secondary | ICD-10-CM | POA: Diagnosis not present

## 2021-05-26 DIAGNOSIS — R059 Cough, unspecified: Secondary | ICD-10-CM | POA: Diagnosis not present

## 2021-05-28 DIAGNOSIS — J449 Chronic obstructive pulmonary disease, unspecified: Secondary | ICD-10-CM | POA: Diagnosis not present

## 2021-05-28 DIAGNOSIS — E538 Deficiency of other specified B group vitamins: Secondary | ICD-10-CM | POA: Diagnosis not present

## 2021-05-28 DIAGNOSIS — R059 Cough, unspecified: Secondary | ICD-10-CM | POA: Diagnosis not present

## 2021-05-28 DIAGNOSIS — R0989 Other specified symptoms and signs involving the circulatory and respiratory systems: Secondary | ICD-10-CM | POA: Diagnosis not present

## 2021-05-28 DIAGNOSIS — D649 Anemia, unspecified: Secondary | ICD-10-CM | POA: Diagnosis not present

## 2021-05-28 DIAGNOSIS — I10 Essential (primary) hypertension: Secondary | ICD-10-CM | POA: Diagnosis not present

## 2021-05-28 DIAGNOSIS — Z79899 Other long term (current) drug therapy: Secondary | ICD-10-CM | POA: Diagnosis not present

## 2021-05-28 DIAGNOSIS — I2510 Atherosclerotic heart disease of native coronary artery without angina pectoris: Secondary | ICD-10-CM | POA: Diagnosis not present

## 2021-05-29 DIAGNOSIS — N390 Urinary tract infection, site not specified: Secondary | ICD-10-CM | POA: Diagnosis not present

## 2021-05-31 DIAGNOSIS — R451 Restlessness and agitation: Secondary | ICD-10-CM | POA: Diagnosis not present

## 2021-05-31 DIAGNOSIS — J449 Chronic obstructive pulmonary disease, unspecified: Secondary | ICD-10-CM | POA: Diagnosis not present

## 2021-05-31 DIAGNOSIS — F03918 Unspecified dementia, unspecified severity, with other behavioral disturbance: Secondary | ICD-10-CM | POA: Diagnosis not present

## 2021-06-14 DIAGNOSIS — M62571 Muscle wasting and atrophy, not elsewhere classified, right ankle and foot: Secondary | ICD-10-CM | POA: Diagnosis not present

## 2021-06-14 DIAGNOSIS — M62572 Muscle wasting and atrophy, not elsewhere classified, left ankle and foot: Secondary | ICD-10-CM | POA: Diagnosis not present

## 2021-06-28 DIAGNOSIS — R21 Rash and other nonspecific skin eruption: Secondary | ICD-10-CM | POA: Diagnosis not present

## 2021-06-28 DIAGNOSIS — Z87440 Personal history of urinary (tract) infections: Secondary | ICD-10-CM | POA: Diagnosis not present

## 2021-06-29 DIAGNOSIS — N390 Urinary tract infection, site not specified: Secondary | ICD-10-CM | POA: Diagnosis not present

## 2021-07-15 DIAGNOSIS — M62571 Muscle wasting and atrophy, not elsewhere classified, right ankle and foot: Secondary | ICD-10-CM | POA: Diagnosis not present

## 2021-07-15 DIAGNOSIS — M62572 Muscle wasting and atrophy, not elsewhere classified, left ankle and foot: Secondary | ICD-10-CM | POA: Diagnosis not present

## 2021-07-18 DIAGNOSIS — G894 Chronic pain syndrome: Secondary | ICD-10-CM | POA: Diagnosis not present

## 2021-07-18 DIAGNOSIS — R269 Unspecified abnormalities of gait and mobility: Secondary | ICD-10-CM | POA: Diagnosis not present

## 2021-07-18 DIAGNOSIS — M6281 Muscle weakness (generalized): Secondary | ICD-10-CM | POA: Diagnosis not present

## 2021-08-08 DIAGNOSIS — J449 Chronic obstructive pulmonary disease, unspecified: Secondary | ICD-10-CM | POA: Diagnosis not present

## 2021-08-08 DIAGNOSIS — E785 Hyperlipidemia, unspecified: Secondary | ICD-10-CM | POA: Diagnosis not present

## 2021-08-08 DIAGNOSIS — R2681 Unsteadiness on feet: Secondary | ICD-10-CM | POA: Diagnosis not present

## 2021-08-08 DIAGNOSIS — R262 Difficulty in walking, not elsewhere classified: Secondary | ICD-10-CM | POA: Diagnosis not present

## 2021-08-08 DIAGNOSIS — F02818 Dementia in other diseases classified elsewhere, unspecified severity, with other behavioral disturbance: Secondary | ICD-10-CM | POA: Diagnosis not present

## 2021-08-08 DIAGNOSIS — M6259 Muscle wasting and atrophy, not elsewhere classified, multiple sites: Secondary | ICD-10-CM | POA: Diagnosis not present

## 2021-08-09 DIAGNOSIS — R262 Difficulty in walking, not elsewhere classified: Secondary | ICD-10-CM | POA: Diagnosis not present

## 2021-08-09 DIAGNOSIS — R2681 Unsteadiness on feet: Secondary | ICD-10-CM | POA: Diagnosis not present

## 2021-08-09 DIAGNOSIS — J449 Chronic obstructive pulmonary disease, unspecified: Secondary | ICD-10-CM | POA: Diagnosis not present

## 2021-08-09 DIAGNOSIS — M6259 Muscle wasting and atrophy, not elsewhere classified, multiple sites: Secondary | ICD-10-CM | POA: Diagnosis not present

## 2021-08-10 DIAGNOSIS — R278 Other lack of coordination: Secondary | ICD-10-CM | POA: Diagnosis not present

## 2021-08-10 DIAGNOSIS — M6281 Muscle weakness (generalized): Secondary | ICD-10-CM | POA: Diagnosis not present

## 2021-08-13 DIAGNOSIS — R278 Other lack of coordination: Secondary | ICD-10-CM | POA: Diagnosis not present

## 2021-08-13 DIAGNOSIS — M6281 Muscle weakness (generalized): Secondary | ICD-10-CM | POA: Diagnosis not present

## 2021-08-14 DIAGNOSIS — J449 Chronic obstructive pulmonary disease, unspecified: Secondary | ICD-10-CM | POA: Diagnosis not present

## 2021-08-14 DIAGNOSIS — M62572 Muscle wasting and atrophy, not elsewhere classified, left ankle and foot: Secondary | ICD-10-CM | POA: Diagnosis not present

## 2021-08-14 DIAGNOSIS — M62571 Muscle wasting and atrophy, not elsewhere classified, right ankle and foot: Secondary | ICD-10-CM | POA: Diagnosis not present

## 2021-08-14 DIAGNOSIS — E785 Hyperlipidemia, unspecified: Secondary | ICD-10-CM | POA: Diagnosis not present

## 2021-08-14 DIAGNOSIS — F02818 Dementia in other diseases classified elsewhere, unspecified severity, with other behavioral disturbance: Secondary | ICD-10-CM | POA: Diagnosis not present

## 2021-08-15 DIAGNOSIS — M6281 Muscle weakness (generalized): Secondary | ICD-10-CM | POA: Diagnosis not present

## 2021-08-15 DIAGNOSIS — J449 Chronic obstructive pulmonary disease, unspecified: Secondary | ICD-10-CM | POA: Diagnosis not present

## 2021-08-15 DIAGNOSIS — R278 Other lack of coordination: Secondary | ICD-10-CM | POA: Diagnosis not present

## 2021-08-15 DIAGNOSIS — R262 Difficulty in walking, not elsewhere classified: Secondary | ICD-10-CM | POA: Diagnosis not present

## 2021-08-15 DIAGNOSIS — M6259 Muscle wasting and atrophy, not elsewhere classified, multiple sites: Secondary | ICD-10-CM | POA: Diagnosis not present

## 2021-08-15 DIAGNOSIS — R2681 Unsteadiness on feet: Secondary | ICD-10-CM | POA: Diagnosis not present

## 2021-08-17 DIAGNOSIS — J449 Chronic obstructive pulmonary disease, unspecified: Secondary | ICD-10-CM | POA: Diagnosis not present

## 2021-08-17 DIAGNOSIS — R2681 Unsteadiness on feet: Secondary | ICD-10-CM | POA: Diagnosis not present

## 2021-08-17 DIAGNOSIS — M6259 Muscle wasting and atrophy, not elsewhere classified, multiple sites: Secondary | ICD-10-CM | POA: Diagnosis not present

## 2021-08-17 DIAGNOSIS — R262 Difficulty in walking, not elsewhere classified: Secondary | ICD-10-CM | POA: Diagnosis not present

## 2021-08-23 DIAGNOSIS — W19XXXA Unspecified fall, initial encounter: Secondary | ICD-10-CM | POA: Diagnosis not present

## 2021-08-23 DIAGNOSIS — M6281 Muscle weakness (generalized): Secondary | ICD-10-CM | POA: Diagnosis not present

## 2021-08-23 DIAGNOSIS — R269 Unspecified abnormalities of gait and mobility: Secondary | ICD-10-CM | POA: Diagnosis not present

## 2021-08-23 DIAGNOSIS — Z5181 Encounter for therapeutic drug level monitoring: Secondary | ICD-10-CM | POA: Diagnosis not present

## 2021-08-24 DIAGNOSIS — R21 Rash and other nonspecific skin eruption: Secondary | ICD-10-CM | POA: Diagnosis not present

## 2021-08-24 DIAGNOSIS — L249 Irritant contact dermatitis, unspecified cause: Secondary | ICD-10-CM | POA: Diagnosis not present

## 2021-08-25 DIAGNOSIS — N390 Urinary tract infection, site not specified: Secondary | ICD-10-CM | POA: Diagnosis not present

## 2021-08-28 DIAGNOSIS — J449 Chronic obstructive pulmonary disease, unspecified: Secondary | ICD-10-CM | POA: Diagnosis not present

## 2021-08-28 DIAGNOSIS — F02818 Dementia in other diseases classified elsewhere, unspecified severity, with other behavioral disturbance: Secondary | ICD-10-CM | POA: Diagnosis not present

## 2021-08-28 DIAGNOSIS — L603 Nail dystrophy: Secondary | ICD-10-CM | POA: Diagnosis not present

## 2021-08-28 DIAGNOSIS — B351 Tinea unguium: Secondary | ICD-10-CM | POA: Diagnosis not present

## 2021-08-28 DIAGNOSIS — E119 Type 2 diabetes mellitus without complications: Secondary | ICD-10-CM | POA: Diagnosis not present

## 2021-08-28 DIAGNOSIS — E785 Hyperlipidemia, unspecified: Secondary | ICD-10-CM | POA: Diagnosis not present

## 2021-08-29 DIAGNOSIS — R2681 Unsteadiness on feet: Secondary | ICD-10-CM | POA: Diagnosis not present

## 2021-08-29 DIAGNOSIS — R262 Difficulty in walking, not elsewhere classified: Secondary | ICD-10-CM | POA: Diagnosis not present

## 2021-08-29 DIAGNOSIS — E785 Hyperlipidemia, unspecified: Secondary | ICD-10-CM | POA: Diagnosis not present

## 2021-08-29 DIAGNOSIS — R278 Other lack of coordination: Secondary | ICD-10-CM | POA: Diagnosis not present

## 2021-08-29 DIAGNOSIS — J449 Chronic obstructive pulmonary disease, unspecified: Secondary | ICD-10-CM | POA: Diagnosis not present

## 2021-08-29 DIAGNOSIS — F02818 Dementia in other diseases classified elsewhere, unspecified severity, with other behavioral disturbance: Secondary | ICD-10-CM | POA: Diagnosis not present

## 2021-08-29 DIAGNOSIS — M6281 Muscle weakness (generalized): Secondary | ICD-10-CM | POA: Diagnosis not present

## 2021-08-29 DIAGNOSIS — M6259 Muscle wasting and atrophy, not elsewhere classified, multiple sites: Secondary | ICD-10-CM | POA: Diagnosis not present

## 2021-08-31 DIAGNOSIS — J449 Chronic obstructive pulmonary disease, unspecified: Secondary | ICD-10-CM | POA: Diagnosis not present

## 2021-08-31 DIAGNOSIS — M6281 Muscle weakness (generalized): Secondary | ICD-10-CM | POA: Diagnosis not present

## 2021-08-31 DIAGNOSIS — R262 Difficulty in walking, not elsewhere classified: Secondary | ICD-10-CM | POA: Diagnosis not present

## 2021-08-31 DIAGNOSIS — R278 Other lack of coordination: Secondary | ICD-10-CM | POA: Diagnosis not present

## 2021-08-31 DIAGNOSIS — R2681 Unsteadiness on feet: Secondary | ICD-10-CM | POA: Diagnosis not present

## 2021-08-31 DIAGNOSIS — M6259 Muscle wasting and atrophy, not elsewhere classified, multiple sites: Secondary | ICD-10-CM | POA: Diagnosis not present

## 2021-09-01 DIAGNOSIS — M6259 Muscle wasting and atrophy, not elsewhere classified, multiple sites: Secondary | ICD-10-CM | POA: Diagnosis not present

## 2021-09-01 DIAGNOSIS — J449 Chronic obstructive pulmonary disease, unspecified: Secondary | ICD-10-CM | POA: Diagnosis not present

## 2021-09-01 DIAGNOSIS — R2681 Unsteadiness on feet: Secondary | ICD-10-CM | POA: Diagnosis not present

## 2021-09-01 DIAGNOSIS — R262 Difficulty in walking, not elsewhere classified: Secondary | ICD-10-CM | POA: Diagnosis not present

## 2021-09-03 DIAGNOSIS — M6259 Muscle wasting and atrophy, not elsewhere classified, multiple sites: Secondary | ICD-10-CM | POA: Diagnosis not present

## 2021-09-03 DIAGNOSIS — J449 Chronic obstructive pulmonary disease, unspecified: Secondary | ICD-10-CM | POA: Diagnosis not present

## 2021-09-03 DIAGNOSIS — R262 Difficulty in walking, not elsewhere classified: Secondary | ICD-10-CM | POA: Diagnosis not present

## 2021-09-03 DIAGNOSIS — E785 Hyperlipidemia, unspecified: Secondary | ICD-10-CM | POA: Diagnosis not present

## 2021-09-03 DIAGNOSIS — R2681 Unsteadiness on feet: Secondary | ICD-10-CM | POA: Diagnosis not present

## 2021-09-03 DIAGNOSIS — F02818 Dementia in other diseases classified elsewhere, unspecified severity, with other behavioral disturbance: Secondary | ICD-10-CM | POA: Diagnosis not present

## 2021-09-04 DIAGNOSIS — E785 Hyperlipidemia, unspecified: Secondary | ICD-10-CM | POA: Diagnosis not present

## 2021-09-04 DIAGNOSIS — M6281 Muscle weakness (generalized): Secondary | ICD-10-CM | POA: Diagnosis not present

## 2021-09-04 DIAGNOSIS — J449 Chronic obstructive pulmonary disease, unspecified: Secondary | ICD-10-CM | POA: Diagnosis not present

## 2021-09-04 DIAGNOSIS — F02818 Dementia in other diseases classified elsewhere, unspecified severity, with other behavioral disturbance: Secondary | ICD-10-CM | POA: Diagnosis not present

## 2021-09-04 DIAGNOSIS — R278 Other lack of coordination: Secondary | ICD-10-CM | POA: Diagnosis not present

## 2021-09-05 DIAGNOSIS — R2681 Unsteadiness on feet: Secondary | ICD-10-CM | POA: Diagnosis not present

## 2021-09-05 DIAGNOSIS — J449 Chronic obstructive pulmonary disease, unspecified: Secondary | ICD-10-CM | POA: Diagnosis not present

## 2021-09-05 DIAGNOSIS — R262 Difficulty in walking, not elsewhere classified: Secondary | ICD-10-CM | POA: Diagnosis not present

## 2021-09-05 DIAGNOSIS — E785 Hyperlipidemia, unspecified: Secondary | ICD-10-CM | POA: Diagnosis not present

## 2021-09-05 DIAGNOSIS — F02818 Dementia in other diseases classified elsewhere, unspecified severity, with other behavioral disturbance: Secondary | ICD-10-CM | POA: Diagnosis not present

## 2021-09-05 DIAGNOSIS — R21 Rash and other nonspecific skin eruption: Secondary | ICD-10-CM | POA: Diagnosis not present

## 2021-09-05 DIAGNOSIS — M6259 Muscle wasting and atrophy, not elsewhere classified, multiple sites: Secondary | ICD-10-CM | POA: Diagnosis not present

## 2021-09-06 DIAGNOSIS — M6281 Muscle weakness (generalized): Secondary | ICD-10-CM | POA: Diagnosis not present

## 2021-09-06 DIAGNOSIS — R278 Other lack of coordination: Secondary | ICD-10-CM | POA: Diagnosis not present

## 2021-09-07 DIAGNOSIS — M6281 Muscle weakness (generalized): Secondary | ICD-10-CM | POA: Diagnosis not present

## 2021-09-07 DIAGNOSIS — R278 Other lack of coordination: Secondary | ICD-10-CM | POA: Diagnosis not present

## 2021-09-10 DIAGNOSIS — M6281 Muscle weakness (generalized): Secondary | ICD-10-CM | POA: Diagnosis not present

## 2021-09-10 DIAGNOSIS — R278 Other lack of coordination: Secondary | ICD-10-CM | POA: Diagnosis not present

## 2021-09-11 DIAGNOSIS — J449 Chronic obstructive pulmonary disease, unspecified: Secondary | ICD-10-CM | POA: Diagnosis not present

## 2021-09-11 DIAGNOSIS — F02818 Dementia in other diseases classified elsewhere, unspecified severity, with other behavioral disturbance: Secondary | ICD-10-CM | POA: Diagnosis not present

## 2021-09-11 DIAGNOSIS — E785 Hyperlipidemia, unspecified: Secondary | ICD-10-CM | POA: Diagnosis not present

## 2021-09-12 DIAGNOSIS — M6259 Muscle wasting and atrophy, not elsewhere classified, multiple sites: Secondary | ICD-10-CM | POA: Diagnosis not present

## 2021-09-12 DIAGNOSIS — R278 Other lack of coordination: Secondary | ICD-10-CM | POA: Diagnosis not present

## 2021-09-12 DIAGNOSIS — F02818 Dementia in other diseases classified elsewhere, unspecified severity, with other behavioral disturbance: Secondary | ICD-10-CM | POA: Diagnosis not present

## 2021-09-12 DIAGNOSIS — E785 Hyperlipidemia, unspecified: Secondary | ICD-10-CM | POA: Diagnosis not present

## 2021-09-12 DIAGNOSIS — R262 Difficulty in walking, not elsewhere classified: Secondary | ICD-10-CM | POA: Diagnosis not present

## 2021-09-12 DIAGNOSIS — M6281 Muscle weakness (generalized): Secondary | ICD-10-CM | POA: Diagnosis not present

## 2021-09-12 DIAGNOSIS — R2681 Unsteadiness on feet: Secondary | ICD-10-CM | POA: Diagnosis not present

## 2021-09-12 DIAGNOSIS — J449 Chronic obstructive pulmonary disease, unspecified: Secondary | ICD-10-CM | POA: Diagnosis not present

## 2021-09-13 DIAGNOSIS — Z87440 Personal history of urinary (tract) infections: Secondary | ICD-10-CM | POA: Diagnosis not present

## 2021-09-13 DIAGNOSIS — M6259 Muscle wasting and atrophy, not elsewhere classified, multiple sites: Secondary | ICD-10-CM | POA: Diagnosis not present

## 2021-09-13 DIAGNOSIS — R2681 Unsteadiness on feet: Secondary | ICD-10-CM | POA: Diagnosis not present

## 2021-09-13 DIAGNOSIS — J449 Chronic obstructive pulmonary disease, unspecified: Secondary | ICD-10-CM | POA: Diagnosis not present

## 2021-09-13 DIAGNOSIS — R262 Difficulty in walking, not elsewhere classified: Secondary | ICD-10-CM | POA: Diagnosis not present

## 2021-09-14 DIAGNOSIS — M62571 Muscle wasting and atrophy, not elsewhere classified, right ankle and foot: Secondary | ICD-10-CM | POA: Diagnosis not present

## 2021-09-14 DIAGNOSIS — R262 Difficulty in walking, not elsewhere classified: Secondary | ICD-10-CM | POA: Diagnosis not present

## 2021-09-14 DIAGNOSIS — R2681 Unsteadiness on feet: Secondary | ICD-10-CM | POA: Diagnosis not present

## 2021-09-14 DIAGNOSIS — M62572 Muscle wasting and atrophy, not elsewhere classified, left ankle and foot: Secondary | ICD-10-CM | POA: Diagnosis not present

## 2021-09-14 DIAGNOSIS — M6259 Muscle wasting and atrophy, not elsewhere classified, multiple sites: Secondary | ICD-10-CM | POA: Diagnosis not present

## 2021-09-14 DIAGNOSIS — J449 Chronic obstructive pulmonary disease, unspecified: Secondary | ICD-10-CM | POA: Diagnosis not present

## 2021-09-17 DIAGNOSIS — M6281 Muscle weakness (generalized): Secondary | ICD-10-CM | POA: Diagnosis not present

## 2021-09-17 DIAGNOSIS — R2681 Unsteadiness on feet: Secondary | ICD-10-CM | POA: Diagnosis not present

## 2021-09-17 DIAGNOSIS — R278 Other lack of coordination: Secondary | ICD-10-CM | POA: Diagnosis not present

## 2021-09-17 DIAGNOSIS — J449 Chronic obstructive pulmonary disease, unspecified: Secondary | ICD-10-CM | POA: Diagnosis not present

## 2021-09-17 DIAGNOSIS — M6259 Muscle wasting and atrophy, not elsewhere classified, multiple sites: Secondary | ICD-10-CM | POA: Diagnosis not present

## 2021-09-17 DIAGNOSIS — R262 Difficulty in walking, not elsewhere classified: Secondary | ICD-10-CM | POA: Diagnosis not present

## 2021-09-18 DIAGNOSIS — N390 Urinary tract infection, site not specified: Secondary | ICD-10-CM | POA: Diagnosis not present

## 2021-09-19 DIAGNOSIS — J449 Chronic obstructive pulmonary disease, unspecified: Secondary | ICD-10-CM | POA: Diagnosis not present

## 2021-09-19 DIAGNOSIS — M6281 Muscle weakness (generalized): Secondary | ICD-10-CM | POA: Diagnosis not present

## 2021-09-19 DIAGNOSIS — R278 Other lack of coordination: Secondary | ICD-10-CM | POA: Diagnosis not present

## 2021-09-19 DIAGNOSIS — F02818 Dementia in other diseases classified elsewhere, unspecified severity, with other behavioral disturbance: Secondary | ICD-10-CM | POA: Diagnosis not present

## 2021-09-19 DIAGNOSIS — E785 Hyperlipidemia, unspecified: Secondary | ICD-10-CM | POA: Diagnosis not present

## 2021-09-20 DIAGNOSIS — R2689 Other abnormalities of gait and mobility: Secondary | ICD-10-CM | POA: Diagnosis not present

## 2021-09-20 DIAGNOSIS — W19XXXA Unspecified fall, initial encounter: Secondary | ICD-10-CM | POA: Diagnosis not present

## 2021-09-20 DIAGNOSIS — R269 Unspecified abnormalities of gait and mobility: Secondary | ICD-10-CM | POA: Diagnosis not present

## 2021-09-20 DIAGNOSIS — B9562 Methicillin resistant Staphylococcus aureus infection as the cause of diseases classified elsewhere: Secondary | ICD-10-CM | POA: Diagnosis not present

## 2021-09-20 DIAGNOSIS — M6281 Muscle weakness (generalized): Secondary | ICD-10-CM | POA: Diagnosis not present

## 2021-09-20 DIAGNOSIS — N390 Urinary tract infection, site not specified: Secondary | ICD-10-CM | POA: Diagnosis not present

## 2021-09-20 DIAGNOSIS — R278 Other lack of coordination: Secondary | ICD-10-CM | POA: Diagnosis not present

## 2021-09-21 DIAGNOSIS — R2681 Unsteadiness on feet: Secondary | ICD-10-CM | POA: Diagnosis not present

## 2021-09-21 DIAGNOSIS — R262 Difficulty in walking, not elsewhere classified: Secondary | ICD-10-CM | POA: Diagnosis not present

## 2021-09-21 DIAGNOSIS — J449 Chronic obstructive pulmonary disease, unspecified: Secondary | ICD-10-CM | POA: Diagnosis not present

## 2021-09-21 DIAGNOSIS — M6259 Muscle wasting and atrophy, not elsewhere classified, multiple sites: Secondary | ICD-10-CM | POA: Diagnosis not present

## 2021-09-24 DIAGNOSIS — R262 Difficulty in walking, not elsewhere classified: Secondary | ICD-10-CM | POA: Diagnosis not present

## 2021-09-24 DIAGNOSIS — M6259 Muscle wasting and atrophy, not elsewhere classified, multiple sites: Secondary | ICD-10-CM | POA: Diagnosis not present

## 2021-09-24 DIAGNOSIS — R278 Other lack of coordination: Secondary | ICD-10-CM | POA: Diagnosis not present

## 2021-09-24 DIAGNOSIS — M6281 Muscle weakness (generalized): Secondary | ICD-10-CM | POA: Diagnosis not present

## 2021-09-24 DIAGNOSIS — J449 Chronic obstructive pulmonary disease, unspecified: Secondary | ICD-10-CM | POA: Diagnosis not present

## 2021-09-24 DIAGNOSIS — R2681 Unsteadiness on feet: Secondary | ICD-10-CM | POA: Diagnosis not present

## 2021-09-26 DIAGNOSIS — R2681 Unsteadiness on feet: Secondary | ICD-10-CM | POA: Diagnosis not present

## 2021-09-26 DIAGNOSIS — R262 Difficulty in walking, not elsewhere classified: Secondary | ICD-10-CM | POA: Diagnosis not present

## 2021-09-26 DIAGNOSIS — M6281 Muscle weakness (generalized): Secondary | ICD-10-CM | POA: Diagnosis not present

## 2021-09-26 DIAGNOSIS — M6259 Muscle wasting and atrophy, not elsewhere classified, multiple sites: Secondary | ICD-10-CM | POA: Diagnosis not present

## 2021-09-26 DIAGNOSIS — J449 Chronic obstructive pulmonary disease, unspecified: Secondary | ICD-10-CM | POA: Diagnosis not present

## 2021-09-26 DIAGNOSIS — R278 Other lack of coordination: Secondary | ICD-10-CM | POA: Diagnosis not present

## 2021-09-27 DIAGNOSIS — R262 Difficulty in walking, not elsewhere classified: Secondary | ICD-10-CM | POA: Diagnosis not present

## 2021-09-27 DIAGNOSIS — M6259 Muscle wasting and atrophy, not elsewhere classified, multiple sites: Secondary | ICD-10-CM | POA: Diagnosis not present

## 2021-09-27 DIAGNOSIS — J449 Chronic obstructive pulmonary disease, unspecified: Secondary | ICD-10-CM | POA: Diagnosis not present

## 2021-09-27 DIAGNOSIS — R2681 Unsteadiness on feet: Secondary | ICD-10-CM | POA: Diagnosis not present

## 2021-10-01 DIAGNOSIS — R2681 Unsteadiness on feet: Secondary | ICD-10-CM | POA: Diagnosis not present

## 2021-10-01 DIAGNOSIS — R262 Difficulty in walking, not elsewhere classified: Secondary | ICD-10-CM | POA: Diagnosis not present

## 2021-10-01 DIAGNOSIS — J449 Chronic obstructive pulmonary disease, unspecified: Secondary | ICD-10-CM | POA: Diagnosis not present

## 2021-10-01 DIAGNOSIS — M6259 Muscle wasting and atrophy, not elsewhere classified, multiple sites: Secondary | ICD-10-CM | POA: Diagnosis not present

## 2021-10-03 DIAGNOSIS — M6259 Muscle wasting and atrophy, not elsewhere classified, multiple sites: Secondary | ICD-10-CM | POA: Diagnosis not present

## 2021-10-03 DIAGNOSIS — M6281 Muscle weakness (generalized): Secondary | ICD-10-CM | POA: Diagnosis not present

## 2021-10-03 DIAGNOSIS — R278 Other lack of coordination: Secondary | ICD-10-CM | POA: Diagnosis not present

## 2021-10-03 DIAGNOSIS — R2681 Unsteadiness on feet: Secondary | ICD-10-CM | POA: Diagnosis not present

## 2021-10-03 DIAGNOSIS — R262 Difficulty in walking, not elsewhere classified: Secondary | ICD-10-CM | POA: Diagnosis not present

## 2021-10-03 DIAGNOSIS — J449 Chronic obstructive pulmonary disease, unspecified: Secondary | ICD-10-CM | POA: Diagnosis not present

## 2021-10-04 DIAGNOSIS — R278 Other lack of coordination: Secondary | ICD-10-CM | POA: Diagnosis not present

## 2021-10-04 DIAGNOSIS — M6281 Muscle weakness (generalized): Secondary | ICD-10-CM | POA: Diagnosis not present

## 2021-10-05 DIAGNOSIS — M1990 Unspecified osteoarthritis, unspecified site: Secondary | ICD-10-CM | POA: Diagnosis not present

## 2021-10-05 DIAGNOSIS — J449 Chronic obstructive pulmonary disease, unspecified: Secondary | ICD-10-CM | POA: Diagnosis not present

## 2021-10-05 DIAGNOSIS — G894 Chronic pain syndrome: Secondary | ICD-10-CM | POA: Diagnosis not present

## 2021-10-05 DIAGNOSIS — I119 Hypertensive heart disease without heart failure: Secondary | ICD-10-CM | POA: Diagnosis not present

## 2021-10-05 DIAGNOSIS — R7303 Prediabetes: Secondary | ICD-10-CM | POA: Diagnosis not present

## 2021-10-08 DIAGNOSIS — D649 Anemia, unspecified: Secondary | ICD-10-CM | POA: Diagnosis not present

## 2021-10-08 DIAGNOSIS — I10 Essential (primary) hypertension: Secondary | ICD-10-CM | POA: Diagnosis not present

## 2021-10-08 DIAGNOSIS — R278 Other lack of coordination: Secondary | ICD-10-CM | POA: Diagnosis not present

## 2021-10-08 DIAGNOSIS — E119 Type 2 diabetes mellitus without complications: Secondary | ICD-10-CM | POA: Diagnosis not present

## 2021-10-08 DIAGNOSIS — M6281 Muscle weakness (generalized): Secondary | ICD-10-CM | POA: Diagnosis not present

## 2021-10-08 DIAGNOSIS — E611 Iron deficiency: Secondary | ICD-10-CM | POA: Diagnosis not present

## 2021-10-10 DIAGNOSIS — M6259 Muscle wasting and atrophy, not elsewhere classified, multiple sites: Secondary | ICD-10-CM | POA: Diagnosis not present

## 2021-10-10 DIAGNOSIS — R278 Other lack of coordination: Secondary | ICD-10-CM | POA: Diagnosis not present

## 2021-10-10 DIAGNOSIS — R799 Abnormal finding of blood chemistry, unspecified: Secondary | ICD-10-CM | POA: Diagnosis not present

## 2021-10-10 DIAGNOSIS — R262 Difficulty in walking, not elsewhere classified: Secondary | ICD-10-CM | POA: Diagnosis not present

## 2021-10-10 DIAGNOSIS — R2681 Unsteadiness on feet: Secondary | ICD-10-CM | POA: Diagnosis not present

## 2021-10-10 DIAGNOSIS — M6281 Muscle weakness (generalized): Secondary | ICD-10-CM | POA: Diagnosis not present

## 2021-10-10 DIAGNOSIS — J449 Chronic obstructive pulmonary disease, unspecified: Secondary | ICD-10-CM | POA: Diagnosis not present

## 2021-10-10 DIAGNOSIS — E1165 Type 2 diabetes mellitus with hyperglycemia: Secondary | ICD-10-CM | POA: Diagnosis not present

## 2021-10-11 DIAGNOSIS — N390 Urinary tract infection, site not specified: Secondary | ICD-10-CM | POA: Diagnosis not present

## 2021-10-12 DIAGNOSIS — M6281 Muscle weakness (generalized): Secondary | ICD-10-CM | POA: Diagnosis not present

## 2021-10-12 DIAGNOSIS — R278 Other lack of coordination: Secondary | ICD-10-CM | POA: Diagnosis not present

## 2021-10-15 DIAGNOSIS — R278 Other lack of coordination: Secondary | ICD-10-CM | POA: Diagnosis not present

## 2021-10-15 DIAGNOSIS — M62571 Muscle wasting and atrophy, not elsewhere classified, right ankle and foot: Secondary | ICD-10-CM | POA: Diagnosis not present

## 2021-10-15 DIAGNOSIS — M6281 Muscle weakness (generalized): Secondary | ICD-10-CM | POA: Diagnosis not present

## 2021-10-15 DIAGNOSIS — M62572 Muscle wasting and atrophy, not elsewhere classified, left ankle and foot: Secondary | ICD-10-CM | POA: Diagnosis not present

## 2021-10-16 DIAGNOSIS — M6281 Muscle weakness (generalized): Secondary | ICD-10-CM | POA: Diagnosis not present

## 2021-10-16 DIAGNOSIS — R278 Other lack of coordination: Secondary | ICD-10-CM | POA: Diagnosis not present

## 2021-10-18 DIAGNOSIS — R278 Other lack of coordination: Secondary | ICD-10-CM | POA: Diagnosis not present

## 2021-10-18 DIAGNOSIS — M6281 Muscle weakness (generalized): Secondary | ICD-10-CM | POA: Diagnosis not present

## 2021-10-18 DIAGNOSIS — E785 Hyperlipidemia, unspecified: Secondary | ICD-10-CM | POA: Diagnosis not present

## 2021-10-18 DIAGNOSIS — F02818 Dementia in other diseases classified elsewhere, unspecified severity, with other behavioral disturbance: Secondary | ICD-10-CM | POA: Diagnosis not present

## 2021-10-18 DIAGNOSIS — J449 Chronic obstructive pulmonary disease, unspecified: Secondary | ICD-10-CM | POA: Diagnosis not present

## 2021-10-22 DIAGNOSIS — F03918 Unspecified dementia, unspecified severity, with other behavioral disturbance: Secondary | ICD-10-CM | POA: Diagnosis not present

## 2021-10-22 DIAGNOSIS — Z87440 Personal history of urinary (tract) infections: Secondary | ICD-10-CM | POA: Diagnosis not present

## 2021-10-23 DIAGNOSIS — F02818 Dementia in other diseases classified elsewhere, unspecified severity, with other behavioral disturbance: Secondary | ICD-10-CM | POA: Diagnosis not present

## 2021-10-23 DIAGNOSIS — J449 Chronic obstructive pulmonary disease, unspecified: Secondary | ICD-10-CM | POA: Diagnosis not present

## 2021-10-23 DIAGNOSIS — E785 Hyperlipidemia, unspecified: Secondary | ICD-10-CM | POA: Diagnosis not present

## 2021-10-25 DIAGNOSIS — E785 Hyperlipidemia, unspecified: Secondary | ICD-10-CM | POA: Diagnosis not present

## 2021-10-25 DIAGNOSIS — J449 Chronic obstructive pulmonary disease, unspecified: Secondary | ICD-10-CM | POA: Diagnosis not present

## 2021-10-25 DIAGNOSIS — F02818 Dementia in other diseases classified elsewhere, unspecified severity, with other behavioral disturbance: Secondary | ICD-10-CM | POA: Diagnosis not present

## 2021-10-26 DIAGNOSIS — M6281 Muscle weakness (generalized): Secondary | ICD-10-CM | POA: Diagnosis not present

## 2021-10-26 DIAGNOSIS — R278 Other lack of coordination: Secondary | ICD-10-CM | POA: Diagnosis not present

## 2021-10-30 DIAGNOSIS — M6259 Muscle wasting and atrophy, not elsewhere classified, multiple sites: Secondary | ICD-10-CM | POA: Diagnosis not present

## 2021-10-30 DIAGNOSIS — F02818 Dementia in other diseases classified elsewhere, unspecified severity, with other behavioral disturbance: Secondary | ICD-10-CM | POA: Diagnosis not present

## 2021-10-30 DIAGNOSIS — E785 Hyperlipidemia, unspecified: Secondary | ICD-10-CM | POA: Diagnosis not present

## 2021-10-30 DIAGNOSIS — J449 Chronic obstructive pulmonary disease, unspecified: Secondary | ICD-10-CM | POA: Diagnosis not present

## 2021-10-30 DIAGNOSIS — R2681 Unsteadiness on feet: Secondary | ICD-10-CM | POA: Diagnosis not present

## 2021-10-30 DIAGNOSIS — R262 Difficulty in walking, not elsewhere classified: Secondary | ICD-10-CM | POA: Diagnosis not present

## 2021-10-31 DIAGNOSIS — F02818 Dementia in other diseases classified elsewhere, unspecified severity, with other behavioral disturbance: Secondary | ICD-10-CM | POA: Diagnosis not present

## 2021-10-31 DIAGNOSIS — E785 Hyperlipidemia, unspecified: Secondary | ICD-10-CM | POA: Diagnosis not present

## 2021-10-31 DIAGNOSIS — J449 Chronic obstructive pulmonary disease, unspecified: Secondary | ICD-10-CM | POA: Diagnosis not present

## 2021-11-01 DIAGNOSIS — M6259 Muscle wasting and atrophy, not elsewhere classified, multiple sites: Secondary | ICD-10-CM | POA: Diagnosis not present

## 2021-11-01 DIAGNOSIS — R262 Difficulty in walking, not elsewhere classified: Secondary | ICD-10-CM | POA: Diagnosis not present

## 2021-11-01 DIAGNOSIS — J449 Chronic obstructive pulmonary disease, unspecified: Secondary | ICD-10-CM | POA: Diagnosis not present

## 2021-11-01 DIAGNOSIS — R2681 Unsteadiness on feet: Secondary | ICD-10-CM | POA: Diagnosis not present

## 2021-11-02 DIAGNOSIS — J449 Chronic obstructive pulmonary disease, unspecified: Secondary | ICD-10-CM | POA: Diagnosis not present

## 2021-11-02 DIAGNOSIS — F02818 Dementia in other diseases classified elsewhere, unspecified severity, with other behavioral disturbance: Secondary | ICD-10-CM | POA: Diagnosis not present

## 2021-11-02 DIAGNOSIS — E785 Hyperlipidemia, unspecified: Secondary | ICD-10-CM | POA: Diagnosis not present

## 2021-11-06 DIAGNOSIS — M6259 Muscle wasting and atrophy, not elsewhere classified, multiple sites: Secondary | ICD-10-CM | POA: Diagnosis not present

## 2021-11-06 DIAGNOSIS — R262 Difficulty in walking, not elsewhere classified: Secondary | ICD-10-CM | POA: Diagnosis not present

## 2021-11-06 DIAGNOSIS — E785 Hyperlipidemia, unspecified: Secondary | ICD-10-CM | POA: Diagnosis not present

## 2021-11-06 DIAGNOSIS — F02818 Dementia in other diseases classified elsewhere, unspecified severity, with other behavioral disturbance: Secondary | ICD-10-CM | POA: Diagnosis not present

## 2021-11-06 DIAGNOSIS — R2681 Unsteadiness on feet: Secondary | ICD-10-CM | POA: Diagnosis not present

## 2021-11-06 DIAGNOSIS — J449 Chronic obstructive pulmonary disease, unspecified: Secondary | ICD-10-CM | POA: Diagnosis not present

## 2021-11-07 DIAGNOSIS — R2681 Unsteadiness on feet: Secondary | ICD-10-CM | POA: Diagnosis not present

## 2021-11-07 DIAGNOSIS — R262 Difficulty in walking, not elsewhere classified: Secondary | ICD-10-CM | POA: Diagnosis not present

## 2021-11-07 DIAGNOSIS — J309 Allergic rhinitis, unspecified: Secondary | ICD-10-CM | POA: Diagnosis not present

## 2021-11-07 DIAGNOSIS — M6259 Muscle wasting and atrophy, not elsewhere classified, multiple sites: Secondary | ICD-10-CM | POA: Diagnosis not present

## 2021-11-07 DIAGNOSIS — R54 Age-related physical debility: Secondary | ICD-10-CM | POA: Diagnosis not present

## 2021-11-07 DIAGNOSIS — J449 Chronic obstructive pulmonary disease, unspecified: Secondary | ICD-10-CM | POA: Diagnosis not present

## 2021-11-08 DIAGNOSIS — J449 Chronic obstructive pulmonary disease, unspecified: Secondary | ICD-10-CM | POA: Diagnosis not present

## 2021-11-08 DIAGNOSIS — F02818 Dementia in other diseases classified elsewhere, unspecified severity, with other behavioral disturbance: Secondary | ICD-10-CM | POA: Diagnosis not present

## 2021-11-08 DIAGNOSIS — E785 Hyperlipidemia, unspecified: Secondary | ICD-10-CM | POA: Diagnosis not present

## 2021-11-09 DIAGNOSIS — R262 Difficulty in walking, not elsewhere classified: Secondary | ICD-10-CM | POA: Diagnosis not present

## 2021-11-09 DIAGNOSIS — R2681 Unsteadiness on feet: Secondary | ICD-10-CM | POA: Diagnosis not present

## 2021-11-09 DIAGNOSIS — J449 Chronic obstructive pulmonary disease, unspecified: Secondary | ICD-10-CM | POA: Diagnosis not present

## 2021-11-09 DIAGNOSIS — M6259 Muscle wasting and atrophy, not elsewhere classified, multiple sites: Secondary | ICD-10-CM | POA: Diagnosis not present

## 2021-11-12 DIAGNOSIS — R21 Rash and other nonspecific skin eruption: Secondary | ICD-10-CM | POA: Diagnosis not present

## 2021-11-14 DIAGNOSIS — R2681 Unsteadiness on feet: Secondary | ICD-10-CM | POA: Diagnosis not present

## 2021-11-14 DIAGNOSIS — R262 Difficulty in walking, not elsewhere classified: Secondary | ICD-10-CM | POA: Diagnosis not present

## 2021-11-14 DIAGNOSIS — M6259 Muscle wasting and atrophy, not elsewhere classified, multiple sites: Secondary | ICD-10-CM | POA: Diagnosis not present

## 2021-11-14 DIAGNOSIS — J449 Chronic obstructive pulmonary disease, unspecified: Secondary | ICD-10-CM | POA: Diagnosis not present

## 2021-11-15 DIAGNOSIS — M6259 Muscle wasting and atrophy, not elsewhere classified, multiple sites: Secondary | ICD-10-CM | POA: Diagnosis not present

## 2021-11-15 DIAGNOSIS — J449 Chronic obstructive pulmonary disease, unspecified: Secondary | ICD-10-CM | POA: Diagnosis not present

## 2021-11-15 DIAGNOSIS — R262 Difficulty in walking, not elsewhere classified: Secondary | ICD-10-CM | POA: Diagnosis not present

## 2021-11-15 DIAGNOSIS — R2681 Unsteadiness on feet: Secondary | ICD-10-CM | POA: Diagnosis not present

## 2021-11-19 DIAGNOSIS — F02818 Dementia in other diseases classified elsewhere, unspecified severity, with other behavioral disturbance: Secondary | ICD-10-CM | POA: Diagnosis not present

## 2021-11-19 DIAGNOSIS — E785 Hyperlipidemia, unspecified: Secondary | ICD-10-CM | POA: Diagnosis not present

## 2021-11-19 DIAGNOSIS — J449 Chronic obstructive pulmonary disease, unspecified: Secondary | ICD-10-CM | POA: Diagnosis not present

## 2021-11-22 DIAGNOSIS — F02818 Dementia in other diseases classified elsewhere, unspecified severity, with other behavioral disturbance: Secondary | ICD-10-CM | POA: Diagnosis not present

## 2021-11-22 DIAGNOSIS — J449 Chronic obstructive pulmonary disease, unspecified: Secondary | ICD-10-CM | POA: Diagnosis not present

## 2021-11-22 DIAGNOSIS — E785 Hyperlipidemia, unspecified: Secondary | ICD-10-CM | POA: Diagnosis not present

## 2021-11-23 DIAGNOSIS — R262 Difficulty in walking, not elsewhere classified: Secondary | ICD-10-CM | POA: Diagnosis not present

## 2021-11-23 DIAGNOSIS — E785 Hyperlipidemia, unspecified: Secondary | ICD-10-CM | POA: Diagnosis not present

## 2021-11-23 DIAGNOSIS — M6259 Muscle wasting and atrophy, not elsewhere classified, multiple sites: Secondary | ICD-10-CM | POA: Diagnosis not present

## 2021-11-23 DIAGNOSIS — J449 Chronic obstructive pulmonary disease, unspecified: Secondary | ICD-10-CM | POA: Diagnosis not present

## 2021-11-23 DIAGNOSIS — R2681 Unsteadiness on feet: Secondary | ICD-10-CM | POA: Diagnosis not present

## 2021-11-23 DIAGNOSIS — F02818 Dementia in other diseases classified elsewhere, unspecified severity, with other behavioral disturbance: Secondary | ICD-10-CM | POA: Diagnosis not present

## 2021-11-26 DIAGNOSIS — J449 Chronic obstructive pulmonary disease, unspecified: Secondary | ICD-10-CM | POA: Diagnosis not present

## 2021-11-26 DIAGNOSIS — R2681 Unsteadiness on feet: Secondary | ICD-10-CM | POA: Diagnosis not present

## 2021-11-26 DIAGNOSIS — R262 Difficulty in walking, not elsewhere classified: Secondary | ICD-10-CM | POA: Diagnosis not present

## 2021-11-26 DIAGNOSIS — M6259 Muscle wasting and atrophy, not elsewhere classified, multiple sites: Secondary | ICD-10-CM | POA: Diagnosis not present

## 2021-11-27 DIAGNOSIS — R2681 Unsteadiness on feet: Secondary | ICD-10-CM | POA: Diagnosis not present

## 2021-11-27 DIAGNOSIS — J449 Chronic obstructive pulmonary disease, unspecified: Secondary | ICD-10-CM | POA: Diagnosis not present

## 2021-11-27 DIAGNOSIS — E785 Hyperlipidemia, unspecified: Secondary | ICD-10-CM | POA: Diagnosis not present

## 2021-11-27 DIAGNOSIS — F02818 Dementia in other diseases classified elsewhere, unspecified severity, with other behavioral disturbance: Secondary | ICD-10-CM | POA: Diagnosis not present

## 2021-11-27 DIAGNOSIS — R262 Difficulty in walking, not elsewhere classified: Secondary | ICD-10-CM | POA: Diagnosis not present

## 2021-11-27 DIAGNOSIS — M6259 Muscle wasting and atrophy, not elsewhere classified, multiple sites: Secondary | ICD-10-CM | POA: Diagnosis not present

## 2021-11-28 DIAGNOSIS — J449 Chronic obstructive pulmonary disease, unspecified: Secondary | ICD-10-CM | POA: Diagnosis not present

## 2021-11-28 DIAGNOSIS — F02818 Dementia in other diseases classified elsewhere, unspecified severity, with other behavioral disturbance: Secondary | ICD-10-CM | POA: Diagnosis not present

## 2021-11-28 DIAGNOSIS — E785 Hyperlipidemia, unspecified: Secondary | ICD-10-CM | POA: Diagnosis not present

## 2021-11-29 DIAGNOSIS — F02818 Dementia in other diseases classified elsewhere, unspecified severity, with other behavioral disturbance: Secondary | ICD-10-CM | POA: Diagnosis not present

## 2021-11-29 DIAGNOSIS — E785 Hyperlipidemia, unspecified: Secondary | ICD-10-CM | POA: Diagnosis not present

## 2021-11-29 DIAGNOSIS — J449 Chronic obstructive pulmonary disease, unspecified: Secondary | ICD-10-CM | POA: Diagnosis not present

## 2021-11-30 DIAGNOSIS — M6259 Muscle wasting and atrophy, not elsewhere classified, multiple sites: Secondary | ICD-10-CM | POA: Diagnosis not present

## 2021-11-30 DIAGNOSIS — J449 Chronic obstructive pulmonary disease, unspecified: Secondary | ICD-10-CM | POA: Diagnosis not present

## 2021-11-30 DIAGNOSIS — R262 Difficulty in walking, not elsewhere classified: Secondary | ICD-10-CM | POA: Diagnosis not present

## 2021-11-30 DIAGNOSIS — R2681 Unsteadiness on feet: Secondary | ICD-10-CM | POA: Diagnosis not present

## 2021-12-03 DIAGNOSIS — F02818 Dementia in other diseases classified elsewhere, unspecified severity, with other behavioral disturbance: Secondary | ICD-10-CM | POA: Diagnosis not present

## 2021-12-03 DIAGNOSIS — J449 Chronic obstructive pulmonary disease, unspecified: Secondary | ICD-10-CM | POA: Diagnosis not present

## 2021-12-03 DIAGNOSIS — E785 Hyperlipidemia, unspecified: Secondary | ICD-10-CM | POA: Diagnosis not present

## 2021-12-06 DIAGNOSIS — J449 Chronic obstructive pulmonary disease, unspecified: Secondary | ICD-10-CM | POA: Diagnosis not present

## 2021-12-06 DIAGNOSIS — F02818 Dementia in other diseases classified elsewhere, unspecified severity, with other behavioral disturbance: Secondary | ICD-10-CM | POA: Diagnosis not present

## 2021-12-06 DIAGNOSIS — R2681 Unsteadiness on feet: Secondary | ICD-10-CM | POA: Diagnosis not present

## 2021-12-06 DIAGNOSIS — R262 Difficulty in walking, not elsewhere classified: Secondary | ICD-10-CM | POA: Diagnosis not present

## 2021-12-06 DIAGNOSIS — E785 Hyperlipidemia, unspecified: Secondary | ICD-10-CM | POA: Diagnosis not present

## 2021-12-06 DIAGNOSIS — M6259 Muscle wasting and atrophy, not elsewhere classified, multiple sites: Secondary | ICD-10-CM | POA: Diagnosis not present

## 2021-12-07 DIAGNOSIS — B351 Tinea unguium: Secondary | ICD-10-CM | POA: Diagnosis not present

## 2021-12-07 DIAGNOSIS — E785 Hyperlipidemia, unspecified: Secondary | ICD-10-CM | POA: Diagnosis not present

## 2021-12-07 DIAGNOSIS — J449 Chronic obstructive pulmonary disease, unspecified: Secondary | ICD-10-CM | POA: Diagnosis not present

## 2021-12-07 DIAGNOSIS — E119 Type 2 diabetes mellitus without complications: Secondary | ICD-10-CM | POA: Diagnosis not present

## 2021-12-07 DIAGNOSIS — F02818 Dementia in other diseases classified elsewhere, unspecified severity, with other behavioral disturbance: Secondary | ICD-10-CM | POA: Diagnosis not present

## 2021-12-07 DIAGNOSIS — L603 Nail dystrophy: Secondary | ICD-10-CM | POA: Diagnosis not present

## 2021-12-11 DIAGNOSIS — E785 Hyperlipidemia, unspecified: Secondary | ICD-10-CM | POA: Diagnosis not present

## 2021-12-11 DIAGNOSIS — F02818 Dementia in other diseases classified elsewhere, unspecified severity, with other behavioral disturbance: Secondary | ICD-10-CM | POA: Diagnosis not present

## 2021-12-11 DIAGNOSIS — J449 Chronic obstructive pulmonary disease, unspecified: Secondary | ICD-10-CM | POA: Diagnosis not present

## 2021-12-18 DIAGNOSIS — F02818 Dementia in other diseases classified elsewhere, unspecified severity, with other behavioral disturbance: Secondary | ICD-10-CM | POA: Diagnosis not present

## 2021-12-18 DIAGNOSIS — J449 Chronic obstructive pulmonary disease, unspecified: Secondary | ICD-10-CM | POA: Diagnosis not present

## 2021-12-18 DIAGNOSIS — E785 Hyperlipidemia, unspecified: Secondary | ICD-10-CM | POA: Diagnosis not present

## 2021-12-19 DIAGNOSIS — J449 Chronic obstructive pulmonary disease, unspecified: Secondary | ICD-10-CM | POA: Diagnosis not present

## 2021-12-19 DIAGNOSIS — E785 Hyperlipidemia, unspecified: Secondary | ICD-10-CM | POA: Diagnosis not present

## 2021-12-19 DIAGNOSIS — F02818 Dementia in other diseases classified elsewhere, unspecified severity, with other behavioral disturbance: Secondary | ICD-10-CM | POA: Diagnosis not present

## 2021-12-21 DIAGNOSIS — F02818 Dementia in other diseases classified elsewhere, unspecified severity, with other behavioral disturbance: Secondary | ICD-10-CM | POA: Diagnosis not present

## 2021-12-21 DIAGNOSIS — E785 Hyperlipidemia, unspecified: Secondary | ICD-10-CM | POA: Diagnosis not present

## 2021-12-21 DIAGNOSIS — J449 Chronic obstructive pulmonary disease, unspecified: Secondary | ICD-10-CM | POA: Diagnosis not present

## 2021-12-24 DIAGNOSIS — R21 Rash and other nonspecific skin eruption: Secondary | ICD-10-CM | POA: Diagnosis not present

## 2021-12-24 DIAGNOSIS — L299 Pruritus, unspecified: Secondary | ICD-10-CM | POA: Diagnosis not present

## 2021-12-25 DIAGNOSIS — J449 Chronic obstructive pulmonary disease, unspecified: Secondary | ICD-10-CM | POA: Diagnosis not present

## 2021-12-25 DIAGNOSIS — N390 Urinary tract infection, site not specified: Secondary | ICD-10-CM | POA: Diagnosis not present

## 2021-12-25 DIAGNOSIS — E785 Hyperlipidemia, unspecified: Secondary | ICD-10-CM | POA: Diagnosis not present

## 2021-12-25 DIAGNOSIS — F02818 Dementia in other diseases classified elsewhere, unspecified severity, with other behavioral disturbance: Secondary | ICD-10-CM | POA: Diagnosis not present

## 2021-12-26 DIAGNOSIS — J449 Chronic obstructive pulmonary disease, unspecified: Secondary | ICD-10-CM | POA: Diagnosis not present

## 2021-12-26 DIAGNOSIS — F02818 Dementia in other diseases classified elsewhere, unspecified severity, with other behavioral disturbance: Secondary | ICD-10-CM | POA: Diagnosis not present

## 2021-12-26 DIAGNOSIS — E785 Hyperlipidemia, unspecified: Secondary | ICD-10-CM | POA: Diagnosis not present

## 2021-12-27 DIAGNOSIS — F02818 Dementia in other diseases classified elsewhere, unspecified severity, with other behavioral disturbance: Secondary | ICD-10-CM | POA: Diagnosis not present

## 2021-12-27 DIAGNOSIS — J449 Chronic obstructive pulmonary disease, unspecified: Secondary | ICD-10-CM | POA: Diagnosis not present

## 2021-12-27 DIAGNOSIS — E785 Hyperlipidemia, unspecified: Secondary | ICD-10-CM | POA: Diagnosis not present

## 2021-12-28 DIAGNOSIS — J449 Chronic obstructive pulmonary disease, unspecified: Secondary | ICD-10-CM | POA: Diagnosis not present

## 2021-12-28 DIAGNOSIS — F02818 Dementia in other diseases classified elsewhere, unspecified severity, with other behavioral disturbance: Secondary | ICD-10-CM | POA: Diagnosis not present

## 2021-12-28 DIAGNOSIS — E785 Hyperlipidemia, unspecified: Secondary | ICD-10-CM | POA: Diagnosis not present

## 2022-01-07 DIAGNOSIS — F02818 Dementia in other diseases classified elsewhere, unspecified severity, with other behavioral disturbance: Secondary | ICD-10-CM | POA: Diagnosis not present

## 2022-01-07 DIAGNOSIS — E785 Hyperlipidemia, unspecified: Secondary | ICD-10-CM | POA: Diagnosis not present

## 2022-01-07 DIAGNOSIS — J449 Chronic obstructive pulmonary disease, unspecified: Secondary | ICD-10-CM | POA: Diagnosis not present

## 2022-01-08 DIAGNOSIS — F02818 Dementia in other diseases classified elsewhere, unspecified severity, with other behavioral disturbance: Secondary | ICD-10-CM | POA: Diagnosis not present

## 2022-01-08 DIAGNOSIS — J449 Chronic obstructive pulmonary disease, unspecified: Secondary | ICD-10-CM | POA: Diagnosis not present

## 2022-01-08 DIAGNOSIS — E785 Hyperlipidemia, unspecified: Secondary | ICD-10-CM | POA: Diagnosis not present

## 2022-01-09 DIAGNOSIS — E785 Hyperlipidemia, unspecified: Secondary | ICD-10-CM | POA: Diagnosis not present

## 2022-01-09 DIAGNOSIS — F02818 Dementia in other diseases classified elsewhere, unspecified severity, with other behavioral disturbance: Secondary | ICD-10-CM | POA: Diagnosis not present

## 2022-01-09 DIAGNOSIS — J449 Chronic obstructive pulmonary disease, unspecified: Secondary | ICD-10-CM | POA: Diagnosis not present

## 2022-01-10 DIAGNOSIS — E785 Hyperlipidemia, unspecified: Secondary | ICD-10-CM | POA: Diagnosis not present

## 2022-01-10 DIAGNOSIS — J449 Chronic obstructive pulmonary disease, unspecified: Secondary | ICD-10-CM | POA: Diagnosis not present

## 2022-01-10 DIAGNOSIS — F02818 Dementia in other diseases classified elsewhere, unspecified severity, with other behavioral disturbance: Secondary | ICD-10-CM | POA: Diagnosis not present

## 2022-01-13 DIAGNOSIS — Z87440 Personal history of urinary (tract) infections: Secondary | ICD-10-CM | POA: Diagnosis not present

## 2022-01-13 DIAGNOSIS — N390 Urinary tract infection, site not specified: Secondary | ICD-10-CM | POA: Diagnosis not present

## 2022-01-14 DIAGNOSIS — R0989 Other specified symptoms and signs involving the circulatory and respiratory systems: Secondary | ICD-10-CM | POA: Diagnosis not present

## 2022-01-14 DIAGNOSIS — R8290 Unspecified abnormal findings in urine: Secondary | ICD-10-CM | POA: Diagnosis not present

## 2022-01-14 DIAGNOSIS — E785 Hyperlipidemia, unspecified: Secondary | ICD-10-CM | POA: Diagnosis not present

## 2022-01-14 DIAGNOSIS — R21 Rash and other nonspecific skin eruption: Secondary | ICD-10-CM | POA: Diagnosis not present

## 2022-01-14 DIAGNOSIS — F02818 Dementia in other diseases classified elsewhere, unspecified severity, with other behavioral disturbance: Secondary | ICD-10-CM | POA: Diagnosis not present

## 2022-01-14 DIAGNOSIS — J449 Chronic obstructive pulmonary disease, unspecified: Secondary | ICD-10-CM | POA: Diagnosis not present

## 2022-01-14 DIAGNOSIS — R42 Dizziness and giddiness: Secondary | ICD-10-CM | POA: Diagnosis not present

## 2022-01-15 DIAGNOSIS — E785 Hyperlipidemia, unspecified: Secondary | ICD-10-CM | POA: Diagnosis not present

## 2022-01-15 DIAGNOSIS — J449 Chronic obstructive pulmonary disease, unspecified: Secondary | ICD-10-CM | POA: Diagnosis not present

## 2022-01-15 DIAGNOSIS — F02818 Dementia in other diseases classified elsewhere, unspecified severity, with other behavioral disturbance: Secondary | ICD-10-CM | POA: Diagnosis not present

## 2022-01-16 DIAGNOSIS — R5381 Other malaise: Secondary | ICD-10-CM | POA: Diagnosis not present

## 2022-01-16 DIAGNOSIS — F02818 Dementia in other diseases classified elsewhere, unspecified severity, with other behavioral disturbance: Secondary | ICD-10-CM | POA: Diagnosis not present

## 2022-01-16 DIAGNOSIS — U071 COVID-19: Secondary | ICD-10-CM | POA: Diagnosis not present

## 2022-01-16 DIAGNOSIS — J449 Chronic obstructive pulmonary disease, unspecified: Secondary | ICD-10-CM | POA: Diagnosis not present

## 2022-01-16 DIAGNOSIS — E785 Hyperlipidemia, unspecified: Secondary | ICD-10-CM | POA: Diagnosis not present

## 2022-01-17 DIAGNOSIS — R0781 Pleurodynia: Secondary | ICD-10-CM | POA: Diagnosis not present

## 2022-01-17 DIAGNOSIS — F02818 Dementia in other diseases classified elsewhere, unspecified severity, with other behavioral disturbance: Secondary | ICD-10-CM | POA: Diagnosis not present

## 2022-01-17 DIAGNOSIS — R410 Disorientation, unspecified: Secondary | ICD-10-CM | POA: Diagnosis not present

## 2022-01-17 DIAGNOSIS — U071 COVID-19: Secondary | ICD-10-CM | POA: Diagnosis not present

## 2022-01-17 DIAGNOSIS — E785 Hyperlipidemia, unspecified: Secondary | ICD-10-CM | POA: Diagnosis not present

## 2022-01-17 DIAGNOSIS — S51011A Laceration without foreign body of right elbow, initial encounter: Secondary | ICD-10-CM | POA: Diagnosis not present

## 2022-01-17 DIAGNOSIS — Z743 Need for continuous supervision: Secondary | ICD-10-CM | POA: Diagnosis not present

## 2022-01-17 DIAGNOSIS — K449 Diaphragmatic hernia without obstruction or gangrene: Secondary | ICD-10-CM | POA: Diagnosis not present

## 2022-01-17 DIAGNOSIS — J449 Chronic obstructive pulmonary disease, unspecified: Secondary | ICD-10-CM | POA: Diagnosis not present

## 2022-01-17 DIAGNOSIS — W19XXXA Unspecified fall, initial encounter: Secondary | ICD-10-CM | POA: Diagnosis not present

## 2022-01-17 DIAGNOSIS — M25552 Pain in left hip: Secondary | ICD-10-CM | POA: Diagnosis not present

## 2022-01-18 DIAGNOSIS — E785 Hyperlipidemia, unspecified: Secondary | ICD-10-CM | POA: Diagnosis not present

## 2022-01-18 DIAGNOSIS — F02818 Dementia in other diseases classified elsewhere, unspecified severity, with other behavioral disturbance: Secondary | ICD-10-CM | POA: Diagnosis not present

## 2022-01-18 DIAGNOSIS — J449 Chronic obstructive pulmonary disease, unspecified: Secondary | ICD-10-CM | POA: Diagnosis not present

## 2022-01-21 DIAGNOSIS — R269 Unspecified abnormalities of gait and mobility: Secondary | ICD-10-CM | POA: Diagnosis not present

## 2022-01-21 DIAGNOSIS — W19XXXA Unspecified fall, initial encounter: Secondary | ICD-10-CM | POA: Diagnosis not present

## 2022-01-21 DIAGNOSIS — R2689 Other abnormalities of gait and mobility: Secondary | ICD-10-CM | POA: Diagnosis not present

## 2022-01-21 DIAGNOSIS — M6281 Muscle weakness (generalized): Secondary | ICD-10-CM | POA: Diagnosis not present

## 2022-01-21 DIAGNOSIS — J449 Chronic obstructive pulmonary disease, unspecified: Secondary | ICD-10-CM | POA: Diagnosis not present

## 2022-01-21 DIAGNOSIS — Z87440 Personal history of urinary (tract) infections: Secondary | ICD-10-CM | POA: Diagnosis not present

## 2022-01-21 DIAGNOSIS — E785 Hyperlipidemia, unspecified: Secondary | ICD-10-CM | POA: Diagnosis not present

## 2022-01-21 DIAGNOSIS — F02818 Dementia in other diseases classified elsewhere, unspecified severity, with other behavioral disturbance: Secondary | ICD-10-CM | POA: Diagnosis not present

## 2022-01-21 DIAGNOSIS — S51011A Laceration without foreign body of right elbow, initial encounter: Secondary | ICD-10-CM | POA: Diagnosis not present

## 2022-01-21 DIAGNOSIS — Z8616 Personal history of COVID-19: Secondary | ICD-10-CM | POA: Diagnosis not present

## 2022-01-21 DIAGNOSIS — R296 Repeated falls: Secondary | ICD-10-CM | POA: Diagnosis not present

## 2022-01-22 DIAGNOSIS — E785 Hyperlipidemia, unspecified: Secondary | ICD-10-CM | POA: Diagnosis not present

## 2022-01-22 DIAGNOSIS — F02818 Dementia in other diseases classified elsewhere, unspecified severity, with other behavioral disturbance: Secondary | ICD-10-CM | POA: Diagnosis not present

## 2022-01-22 DIAGNOSIS — J449 Chronic obstructive pulmonary disease, unspecified: Secondary | ICD-10-CM | POA: Diagnosis not present

## 2022-01-22 DIAGNOSIS — I10 Essential (primary) hypertension: Secondary | ICD-10-CM | POA: Diagnosis not present

## 2022-01-22 DIAGNOSIS — D649 Anemia, unspecified: Secondary | ICD-10-CM | POA: Diagnosis not present

## 2022-01-23 DIAGNOSIS — J449 Chronic obstructive pulmonary disease, unspecified: Secondary | ICD-10-CM | POA: Diagnosis not present

## 2022-01-23 DIAGNOSIS — F02818 Dementia in other diseases classified elsewhere, unspecified severity, with other behavioral disturbance: Secondary | ICD-10-CM | POA: Diagnosis not present

## 2022-01-23 DIAGNOSIS — E785 Hyperlipidemia, unspecified: Secondary | ICD-10-CM | POA: Diagnosis not present

## 2022-01-24 DIAGNOSIS — E785 Hyperlipidemia, unspecified: Secondary | ICD-10-CM | POA: Diagnosis not present

## 2022-01-24 DIAGNOSIS — F02818 Dementia in other diseases classified elsewhere, unspecified severity, with other behavioral disturbance: Secondary | ICD-10-CM | POA: Diagnosis not present

## 2022-01-24 DIAGNOSIS — J449 Chronic obstructive pulmonary disease, unspecified: Secondary | ICD-10-CM | POA: Diagnosis not present

## 2022-01-24 DIAGNOSIS — N390 Urinary tract infection, site not specified: Secondary | ICD-10-CM | POA: Diagnosis not present

## 2022-01-25 DIAGNOSIS — J449 Chronic obstructive pulmonary disease, unspecified: Secondary | ICD-10-CM | POA: Diagnosis not present

## 2022-01-25 DIAGNOSIS — E785 Hyperlipidemia, unspecified: Secondary | ICD-10-CM | POA: Diagnosis not present

## 2022-01-25 DIAGNOSIS — F02818 Dementia in other diseases classified elsewhere, unspecified severity, with other behavioral disturbance: Secondary | ICD-10-CM | POA: Diagnosis not present

## 2022-01-28 DIAGNOSIS — Z8616 Personal history of COVID-19: Secondary | ICD-10-CM | POA: Diagnosis not present

## 2022-01-28 DIAGNOSIS — W19XXXA Unspecified fall, initial encounter: Secondary | ICD-10-CM | POA: Diagnosis not present

## 2022-01-28 DIAGNOSIS — F02818 Dementia in other diseases classified elsewhere, unspecified severity, with other behavioral disturbance: Secondary | ICD-10-CM | POA: Diagnosis not present

## 2022-01-28 DIAGNOSIS — Z9181 History of falling: Secondary | ICD-10-CM | POA: Diagnosis not present

## 2022-01-28 DIAGNOSIS — M6281 Muscle weakness (generalized): Secondary | ICD-10-CM | POA: Diagnosis not present

## 2022-01-28 DIAGNOSIS — R269 Unspecified abnormalities of gait and mobility: Secondary | ICD-10-CM | POA: Diagnosis not present

## 2022-01-28 DIAGNOSIS — J449 Chronic obstructive pulmonary disease, unspecified: Secondary | ICD-10-CM | POA: Diagnosis not present

## 2022-01-28 DIAGNOSIS — E785 Hyperlipidemia, unspecified: Secondary | ICD-10-CM | POA: Diagnosis not present

## 2022-01-28 DIAGNOSIS — R2689 Other abnormalities of gait and mobility: Secondary | ICD-10-CM | POA: Diagnosis not present

## 2022-01-29 DIAGNOSIS — F02818 Dementia in other diseases classified elsewhere, unspecified severity, with other behavioral disturbance: Secondary | ICD-10-CM | POA: Diagnosis not present

## 2022-01-29 DIAGNOSIS — E785 Hyperlipidemia, unspecified: Secondary | ICD-10-CM | POA: Diagnosis not present

## 2022-01-29 DIAGNOSIS — J449 Chronic obstructive pulmonary disease, unspecified: Secondary | ICD-10-CM | POA: Diagnosis not present

## 2022-01-30 DIAGNOSIS — F02818 Dementia in other diseases classified elsewhere, unspecified severity, with other behavioral disturbance: Secondary | ICD-10-CM | POA: Diagnosis not present

## 2022-01-30 DIAGNOSIS — J449 Chronic obstructive pulmonary disease, unspecified: Secondary | ICD-10-CM | POA: Diagnosis not present

## 2022-01-30 DIAGNOSIS — E785 Hyperlipidemia, unspecified: Secondary | ICD-10-CM | POA: Diagnosis not present

## 2022-01-30 DIAGNOSIS — R296 Repeated falls: Secondary | ICD-10-CM | POA: Diagnosis not present

## 2022-01-30 DIAGNOSIS — M6259 Muscle wasting and atrophy, not elsewhere classified, multiple sites: Secondary | ICD-10-CM | POA: Diagnosis not present

## 2022-01-31 DIAGNOSIS — R2681 Unsteadiness on feet: Secondary | ICD-10-CM | POA: Diagnosis not present

## 2022-01-31 DIAGNOSIS — F02818 Dementia in other diseases classified elsewhere, unspecified severity, with other behavioral disturbance: Secondary | ICD-10-CM | POA: Diagnosis not present

## 2022-01-31 DIAGNOSIS — E785 Hyperlipidemia, unspecified: Secondary | ICD-10-CM | POA: Diagnosis not present

## 2022-01-31 DIAGNOSIS — M6259 Muscle wasting and atrophy, not elsewhere classified, multiple sites: Secondary | ICD-10-CM | POA: Diagnosis not present

## 2022-01-31 DIAGNOSIS — J449 Chronic obstructive pulmonary disease, unspecified: Secondary | ICD-10-CM | POA: Diagnosis not present

## 2022-02-01 DIAGNOSIS — R2681 Unsteadiness on feet: Secondary | ICD-10-CM | POA: Diagnosis not present

## 2022-02-01 DIAGNOSIS — M6259 Muscle wasting and atrophy, not elsewhere classified, multiple sites: Secondary | ICD-10-CM | POA: Diagnosis not present

## 2022-02-06 DIAGNOSIS — M6259 Muscle wasting and atrophy, not elsewhere classified, multiple sites: Secondary | ICD-10-CM | POA: Diagnosis not present

## 2022-02-06 DIAGNOSIS — R296 Repeated falls: Secondary | ICD-10-CM | POA: Diagnosis not present

## 2022-02-07 DIAGNOSIS — M6259 Muscle wasting and atrophy, not elsewhere classified, multiple sites: Secondary | ICD-10-CM | POA: Diagnosis not present

## 2022-02-07 DIAGNOSIS — R2681 Unsteadiness on feet: Secondary | ICD-10-CM | POA: Diagnosis not present

## 2022-02-07 DIAGNOSIS — R296 Repeated falls: Secondary | ICD-10-CM | POA: Diagnosis not present

## 2022-02-08 DIAGNOSIS — M6259 Muscle wasting and atrophy, not elsewhere classified, multiple sites: Secondary | ICD-10-CM | POA: Diagnosis not present

## 2022-02-08 DIAGNOSIS — R2681 Unsteadiness on feet: Secondary | ICD-10-CM | POA: Diagnosis not present

## 2022-02-11 DIAGNOSIS — M6259 Muscle wasting and atrophy, not elsewhere classified, multiple sites: Secondary | ICD-10-CM | POA: Diagnosis not present

## 2022-02-11 DIAGNOSIS — R296 Repeated falls: Secondary | ICD-10-CM | POA: Diagnosis not present

## 2022-02-12 DIAGNOSIS — R2681 Unsteadiness on feet: Secondary | ICD-10-CM | POA: Diagnosis not present

## 2022-02-12 DIAGNOSIS — M6259 Muscle wasting and atrophy, not elsewhere classified, multiple sites: Secondary | ICD-10-CM | POA: Diagnosis not present

## 2022-02-13 DIAGNOSIS — R296 Repeated falls: Secondary | ICD-10-CM | POA: Diagnosis not present

## 2022-02-13 DIAGNOSIS — M6259 Muscle wasting and atrophy, not elsewhere classified, multiple sites: Secondary | ICD-10-CM | POA: Diagnosis not present

## 2022-02-14 DIAGNOSIS — R296 Repeated falls: Secondary | ICD-10-CM | POA: Diagnosis not present

## 2022-02-14 DIAGNOSIS — M6259 Muscle wasting and atrophy, not elsewhere classified, multiple sites: Secondary | ICD-10-CM | POA: Diagnosis not present

## 2022-02-16 DIAGNOSIS — R296 Repeated falls: Secondary | ICD-10-CM | POA: Diagnosis not present

## 2022-02-16 DIAGNOSIS — M6259 Muscle wasting and atrophy, not elsewhere classified, multiple sites: Secondary | ICD-10-CM | POA: Diagnosis not present

## 2022-02-16 DIAGNOSIS — R2681 Unsteadiness on feet: Secondary | ICD-10-CM | POA: Diagnosis not present

## 2022-02-18 DIAGNOSIS — R296 Repeated falls: Secondary | ICD-10-CM | POA: Diagnosis not present

## 2022-02-18 DIAGNOSIS — M6259 Muscle wasting and atrophy, not elsewhere classified, multiple sites: Secondary | ICD-10-CM | POA: Diagnosis not present

## 2022-02-20 DIAGNOSIS — R2681 Unsteadiness on feet: Secondary | ICD-10-CM | POA: Diagnosis not present

## 2022-02-20 DIAGNOSIS — M6259 Muscle wasting and atrophy, not elsewhere classified, multiple sites: Secondary | ICD-10-CM | POA: Diagnosis not present

## 2022-02-21 DIAGNOSIS — M6259 Muscle wasting and atrophy, not elsewhere classified, multiple sites: Secondary | ICD-10-CM | POA: Diagnosis not present

## 2022-02-21 DIAGNOSIS — R2681 Unsteadiness on feet: Secondary | ICD-10-CM | POA: Diagnosis not present

## 2022-02-21 DIAGNOSIS — R296 Repeated falls: Secondary | ICD-10-CM | POA: Diagnosis not present

## 2022-02-22 DIAGNOSIS — M6259 Muscle wasting and atrophy, not elsewhere classified, multiple sites: Secondary | ICD-10-CM | POA: Diagnosis not present

## 2022-02-22 DIAGNOSIS — R2681 Unsteadiness on feet: Secondary | ICD-10-CM | POA: Diagnosis not present

## 2022-02-26 DIAGNOSIS — M6259 Muscle wasting and atrophy, not elsewhere classified, multiple sites: Secondary | ICD-10-CM | POA: Diagnosis not present

## 2022-02-26 DIAGNOSIS — R296 Repeated falls: Secondary | ICD-10-CM | POA: Diagnosis not present

## 2022-02-27 DIAGNOSIS — M6259 Muscle wasting and atrophy, not elsewhere classified, multiple sites: Secondary | ICD-10-CM | POA: Diagnosis not present

## 2022-02-27 DIAGNOSIS — R2681 Unsteadiness on feet: Secondary | ICD-10-CM | POA: Diagnosis not present

## 2022-02-28 DIAGNOSIS — M6259 Muscle wasting and atrophy, not elsewhere classified, multiple sites: Secondary | ICD-10-CM | POA: Diagnosis not present

## 2022-02-28 DIAGNOSIS — R296 Repeated falls: Secondary | ICD-10-CM | POA: Diagnosis not present

## 2022-03-01 DIAGNOSIS — R296 Repeated falls: Secondary | ICD-10-CM | POA: Diagnosis not present

## 2022-03-01 DIAGNOSIS — R2681 Unsteadiness on feet: Secondary | ICD-10-CM | POA: Diagnosis not present

## 2022-03-01 DIAGNOSIS — M6259 Muscle wasting and atrophy, not elsewhere classified, multiple sites: Secondary | ICD-10-CM | POA: Diagnosis not present

## 2022-03-04 DIAGNOSIS — R2681 Unsteadiness on feet: Secondary | ICD-10-CM | POA: Diagnosis not present

## 2022-03-04 DIAGNOSIS — M6259 Muscle wasting and atrophy, not elsewhere classified, multiple sites: Secondary | ICD-10-CM | POA: Diagnosis not present

## 2022-03-05 DIAGNOSIS — M6259 Muscle wasting and atrophy, not elsewhere classified, multiple sites: Secondary | ICD-10-CM | POA: Diagnosis not present

## 2022-03-05 DIAGNOSIS — R2681 Unsteadiness on feet: Secondary | ICD-10-CM | POA: Diagnosis not present

## 2022-03-06 DIAGNOSIS — M6259 Muscle wasting and atrophy, not elsewhere classified, multiple sites: Secondary | ICD-10-CM | POA: Diagnosis not present

## 2022-03-06 DIAGNOSIS — Z6835 Body mass index (BMI) 35.0-35.9, adult: Secondary | ICD-10-CM

## 2022-03-06 DIAGNOSIS — G894 Chronic pain syndrome: Secondary | ICD-10-CM | POA: Diagnosis not present

## 2022-03-06 DIAGNOSIS — M1990 Unspecified osteoarthritis, unspecified site: Secondary | ICD-10-CM | POA: Diagnosis not present

## 2022-03-06 DIAGNOSIS — R296 Repeated falls: Secondary | ICD-10-CM | POA: Diagnosis not present

## 2022-03-06 DIAGNOSIS — R7303 Prediabetes: Secondary | ICD-10-CM | POA: Diagnosis not present

## 2022-03-06 DIAGNOSIS — J449 Chronic obstructive pulmonary disease, unspecified: Secondary | ICD-10-CM | POA: Diagnosis not present

## 2022-03-06 DIAGNOSIS — I119 Hypertensive heart disease without heart failure: Secondary | ICD-10-CM | POA: Diagnosis not present

## 2022-03-06 DIAGNOSIS — F0390 Unspecified dementia without behavioral disturbance: Secondary | ICD-10-CM

## 2022-03-07 DIAGNOSIS — Z1329 Encounter for screening for other suspected endocrine disorder: Secondary | ICD-10-CM | POA: Diagnosis not present

## 2022-03-07 DIAGNOSIS — R296 Repeated falls: Secondary | ICD-10-CM | POA: Diagnosis not present

## 2022-03-07 DIAGNOSIS — E119 Type 2 diabetes mellitus without complications: Secondary | ICD-10-CM | POA: Diagnosis not present

## 2022-03-07 DIAGNOSIS — I10 Essential (primary) hypertension: Secondary | ICD-10-CM | POA: Diagnosis not present

## 2022-03-07 DIAGNOSIS — M6259 Muscle wasting and atrophy, not elsewhere classified, multiple sites: Secondary | ICD-10-CM | POA: Diagnosis not present

## 2022-03-07 DIAGNOSIS — D649 Anemia, unspecified: Secondary | ICD-10-CM | POA: Diagnosis not present

## 2022-03-07 DIAGNOSIS — Z79899 Other long term (current) drug therapy: Secondary | ICD-10-CM | POA: Diagnosis not present

## 2022-03-08 DIAGNOSIS — M6259 Muscle wasting and atrophy, not elsewhere classified, multiple sites: Secondary | ICD-10-CM | POA: Diagnosis not present

## 2022-03-08 DIAGNOSIS — R296 Repeated falls: Secondary | ICD-10-CM | POA: Diagnosis not present

## 2022-03-09 DIAGNOSIS — M6259 Muscle wasting and atrophy, not elsewhere classified, multiple sites: Secondary | ICD-10-CM | POA: Diagnosis not present

## 2022-03-09 DIAGNOSIS — R2681 Unsteadiness on feet: Secondary | ICD-10-CM | POA: Diagnosis not present

## 2022-03-11 DIAGNOSIS — R2681 Unsteadiness on feet: Secondary | ICD-10-CM | POA: Diagnosis not present

## 2022-03-11 DIAGNOSIS — M6259 Muscle wasting and atrophy, not elsewhere classified, multiple sites: Secondary | ICD-10-CM | POA: Diagnosis not present

## 2022-03-11 DIAGNOSIS — R296 Repeated falls: Secondary | ICD-10-CM | POA: Diagnosis not present

## 2022-03-13 DIAGNOSIS — M6259 Muscle wasting and atrophy, not elsewhere classified, multiple sites: Secondary | ICD-10-CM | POA: Diagnosis not present

## 2022-03-13 DIAGNOSIS — R296 Repeated falls: Secondary | ICD-10-CM | POA: Diagnosis not present

## 2022-03-14 DIAGNOSIS — R2681 Unsteadiness on feet: Secondary | ICD-10-CM | POA: Diagnosis not present

## 2022-03-14 DIAGNOSIS — M6259 Muscle wasting and atrophy, not elsewhere classified, multiple sites: Secondary | ICD-10-CM | POA: Diagnosis not present

## 2022-03-14 DIAGNOSIS — R296 Repeated falls: Secondary | ICD-10-CM | POA: Diagnosis not present

## 2022-03-15 DIAGNOSIS — L603 Nail dystrophy: Secondary | ICD-10-CM | POA: Diagnosis not present

## 2022-03-15 DIAGNOSIS — E119 Type 2 diabetes mellitus without complications: Secondary | ICD-10-CM | POA: Diagnosis not present

## 2022-03-15 DIAGNOSIS — B351 Tinea unguium: Secondary | ICD-10-CM | POA: Diagnosis not present

## 2022-03-18 DIAGNOSIS — M6259 Muscle wasting and atrophy, not elsewhere classified, multiple sites: Secondary | ICD-10-CM | POA: Diagnosis not present

## 2022-03-18 DIAGNOSIS — R296 Repeated falls: Secondary | ICD-10-CM | POA: Diagnosis not present

## 2022-03-18 DIAGNOSIS — R2681 Unsteadiness on feet: Secondary | ICD-10-CM | POA: Diagnosis not present

## 2022-03-19 DIAGNOSIS — R296 Repeated falls: Secondary | ICD-10-CM | POA: Diagnosis not present

## 2022-03-19 DIAGNOSIS — M6259 Muscle wasting and atrophy, not elsewhere classified, multiple sites: Secondary | ICD-10-CM | POA: Diagnosis not present

## 2022-03-20 DIAGNOSIS — R2681 Unsteadiness on feet: Secondary | ICD-10-CM | POA: Diagnosis not present

## 2022-03-20 DIAGNOSIS — M6259 Muscle wasting and atrophy, not elsewhere classified, multiple sites: Secondary | ICD-10-CM | POA: Diagnosis not present

## 2022-03-20 DIAGNOSIS — R296 Repeated falls: Secondary | ICD-10-CM | POA: Diagnosis not present

## 2022-03-21 DIAGNOSIS — R269 Unspecified abnormalities of gait and mobility: Secondary | ICD-10-CM | POA: Diagnosis not present

## 2022-03-21 DIAGNOSIS — F0390 Unspecified dementia without behavioral disturbance: Secondary | ICD-10-CM | POA: Diagnosis not present

## 2022-03-21 DIAGNOSIS — R296 Repeated falls: Secondary | ICD-10-CM | POA: Diagnosis not present

## 2022-03-21 DIAGNOSIS — M6259 Muscle wasting and atrophy, not elsewhere classified, multiple sites: Secondary | ICD-10-CM | POA: Diagnosis not present

## 2022-03-21 DIAGNOSIS — R2689 Other abnormalities of gait and mobility: Secondary | ICD-10-CM | POA: Diagnosis not present

## 2022-03-21 DIAGNOSIS — W19XXXA Unspecified fall, initial encounter: Secondary | ICD-10-CM | POA: Diagnosis not present

## 2022-03-21 DIAGNOSIS — Z9181 History of falling: Secondary | ICD-10-CM | POA: Diagnosis not present

## 2022-03-21 DIAGNOSIS — M6281 Muscle weakness (generalized): Secondary | ICD-10-CM | POA: Diagnosis not present

## 2022-03-22 DIAGNOSIS — M6259 Muscle wasting and atrophy, not elsewhere classified, multiple sites: Secondary | ICD-10-CM | POA: Diagnosis not present

## 2022-03-22 DIAGNOSIS — R296 Repeated falls: Secondary | ICD-10-CM | POA: Diagnosis not present

## 2022-03-22 DIAGNOSIS — R2681 Unsteadiness on feet: Secondary | ICD-10-CM | POA: Diagnosis not present

## 2022-03-22 DIAGNOSIS — R2689 Other abnormalities of gait and mobility: Secondary | ICD-10-CM | POA: Diagnosis not present

## 2022-03-22 DIAGNOSIS — F0390 Unspecified dementia without behavioral disturbance: Secondary | ICD-10-CM

## 2022-03-22 DIAGNOSIS — R269 Unspecified abnormalities of gait and mobility: Secondary | ICD-10-CM | POA: Diagnosis not present

## 2022-03-22 DIAGNOSIS — M6281 Muscle weakness (generalized): Secondary | ICD-10-CM | POA: Diagnosis not present

## 2022-03-22 DIAGNOSIS — Z9181 History of falling: Secondary | ICD-10-CM | POA: Diagnosis not present

## 2022-03-23 DIAGNOSIS — M6259 Muscle wasting and atrophy, not elsewhere classified, multiple sites: Secondary | ICD-10-CM | POA: Diagnosis not present

## 2022-03-23 DIAGNOSIS — R2681 Unsteadiness on feet: Secondary | ICD-10-CM | POA: Diagnosis not present

## 2022-03-25 DIAGNOSIS — M6259 Muscle wasting and atrophy, not elsewhere classified, multiple sites: Secondary | ICD-10-CM | POA: Diagnosis not present

## 2022-03-25 DIAGNOSIS — R296 Repeated falls: Secondary | ICD-10-CM | POA: Diagnosis not present

## 2022-03-25 DIAGNOSIS — R2681 Unsteadiness on feet: Secondary | ICD-10-CM | POA: Diagnosis not present

## 2022-03-26 DIAGNOSIS — M6259 Muscle wasting and atrophy, not elsewhere classified, multiple sites: Secondary | ICD-10-CM | POA: Diagnosis not present

## 2022-03-26 DIAGNOSIS — R2681 Unsteadiness on feet: Secondary | ICD-10-CM | POA: Diagnosis not present

## 2022-03-26 DIAGNOSIS — R296 Repeated falls: Secondary | ICD-10-CM | POA: Diagnosis not present

## 2022-03-27 DIAGNOSIS — M6259 Muscle wasting and atrophy, not elsewhere classified, multiple sites: Secondary | ICD-10-CM | POA: Diagnosis not present

## 2022-03-27 DIAGNOSIS — R296 Repeated falls: Secondary | ICD-10-CM | POA: Diagnosis not present

## 2022-03-28 DIAGNOSIS — M6259 Muscle wasting and atrophy, not elsewhere classified, multiple sites: Secondary | ICD-10-CM | POA: Diagnosis not present

## 2022-03-28 DIAGNOSIS — R296 Repeated falls: Secondary | ICD-10-CM | POA: Diagnosis not present

## 2022-03-28 DIAGNOSIS — R2681 Unsteadiness on feet: Secondary | ICD-10-CM | POA: Diagnosis not present

## 2022-03-29 DIAGNOSIS — R296 Repeated falls: Secondary | ICD-10-CM | POA: Diagnosis not present

## 2022-03-29 DIAGNOSIS — R2681 Unsteadiness on feet: Secondary | ICD-10-CM | POA: Diagnosis not present

## 2022-03-29 DIAGNOSIS — M6259 Muscle wasting and atrophy, not elsewhere classified, multiple sites: Secondary | ICD-10-CM | POA: Diagnosis not present

## 2022-04-01 DIAGNOSIS — R296 Repeated falls: Secondary | ICD-10-CM | POA: Diagnosis not present

## 2022-04-01 DIAGNOSIS — M6259 Muscle wasting and atrophy, not elsewhere classified, multiple sites: Secondary | ICD-10-CM | POA: Diagnosis not present

## 2022-04-02 DIAGNOSIS — R262 Difficulty in walking, not elsewhere classified: Secondary | ICD-10-CM | POA: Diagnosis not present

## 2022-04-03 DIAGNOSIS — R2681 Unsteadiness on feet: Secondary | ICD-10-CM | POA: Diagnosis not present

## 2022-04-03 DIAGNOSIS — M6259 Muscle wasting and atrophy, not elsewhere classified, multiple sites: Secondary | ICD-10-CM | POA: Diagnosis not present

## 2022-04-04 DIAGNOSIS — R296 Repeated falls: Secondary | ICD-10-CM | POA: Diagnosis not present

## 2022-04-04 DIAGNOSIS — M6259 Muscle wasting and atrophy, not elsewhere classified, multiple sites: Secondary | ICD-10-CM | POA: Diagnosis not present

## 2022-04-05 DIAGNOSIS — Z87440 Personal history of urinary (tract) infections: Secondary | ICD-10-CM | POA: Diagnosis not present

## 2022-04-05 DIAGNOSIS — R42 Dizziness and giddiness: Secondary | ICD-10-CM | POA: Diagnosis not present

## 2022-04-05 DIAGNOSIS — R296 Repeated falls: Secondary | ICD-10-CM | POA: Diagnosis not present

## 2022-04-05 DIAGNOSIS — I959 Hypotension, unspecified: Secondary | ICD-10-CM | POA: Diagnosis not present

## 2022-04-05 DIAGNOSIS — F0390 Unspecified dementia without behavioral disturbance: Secondary | ICD-10-CM | POA: Diagnosis not present

## 2022-04-05 DIAGNOSIS — M6259 Muscle wasting and atrophy, not elsewhere classified, multiple sites: Secondary | ICD-10-CM | POA: Diagnosis not present

## 2022-04-06 DIAGNOSIS — M6259 Muscle wasting and atrophy, not elsewhere classified, multiple sites: Secondary | ICD-10-CM | POA: Diagnosis not present

## 2022-04-06 DIAGNOSIS — R2681 Unsteadiness on feet: Secondary | ICD-10-CM | POA: Diagnosis not present

## 2022-04-08 DIAGNOSIS — R2681 Unsteadiness on feet: Secondary | ICD-10-CM | POA: Diagnosis not present

## 2022-04-08 DIAGNOSIS — M6259 Muscle wasting and atrophy, not elsewhere classified, multiple sites: Secondary | ICD-10-CM | POA: Diagnosis not present

## 2022-04-09 DIAGNOSIS — R2681 Unsteadiness on feet: Secondary | ICD-10-CM | POA: Diagnosis not present

## 2022-04-09 DIAGNOSIS — R296 Repeated falls: Secondary | ICD-10-CM | POA: Diagnosis not present

## 2022-04-09 DIAGNOSIS — M6259 Muscle wasting and atrophy, not elsewhere classified, multiple sites: Secondary | ICD-10-CM | POA: Diagnosis not present

## 2022-04-10 DIAGNOSIS — R2681 Unsteadiness on feet: Secondary | ICD-10-CM | POA: Diagnosis not present

## 2022-04-10 DIAGNOSIS — R296 Repeated falls: Secondary | ICD-10-CM | POA: Diagnosis not present

## 2022-04-10 DIAGNOSIS — M6259 Muscle wasting and atrophy, not elsewhere classified, multiple sites: Secondary | ICD-10-CM | POA: Diagnosis not present

## 2022-04-11 DIAGNOSIS — N390 Urinary tract infection, site not specified: Secondary | ICD-10-CM | POA: Diagnosis not present

## 2022-04-11 DIAGNOSIS — R2681 Unsteadiness on feet: Secondary | ICD-10-CM | POA: Diagnosis not present

## 2022-04-11 DIAGNOSIS — M6259 Muscle wasting and atrophy, not elsewhere classified, multiple sites: Secondary | ICD-10-CM | POA: Diagnosis not present

## 2022-04-11 DIAGNOSIS — R296 Repeated falls: Secondary | ICD-10-CM | POA: Diagnosis not present

## 2022-04-12 DIAGNOSIS — M6259 Muscle wasting and atrophy, not elsewhere classified, multiple sites: Secondary | ICD-10-CM | POA: Diagnosis not present

## 2022-04-12 DIAGNOSIS — R2681 Unsteadiness on feet: Secondary | ICD-10-CM | POA: Diagnosis not present

## 2022-04-15 DIAGNOSIS — M6259 Muscle wasting and atrophy, not elsewhere classified, multiple sites: Secondary | ICD-10-CM | POA: Diagnosis not present

## 2022-04-15 DIAGNOSIS — R2681 Unsteadiness on feet: Secondary | ICD-10-CM | POA: Diagnosis not present

## 2022-04-16 DIAGNOSIS — M62511 Muscle wasting and atrophy, not elsewhere classified, right shoulder: Secondary | ICD-10-CM | POA: Diagnosis not present

## 2022-04-16 DIAGNOSIS — R296 Repeated falls: Secondary | ICD-10-CM | POA: Diagnosis not present

## 2022-04-16 DIAGNOSIS — M62512 Muscle wasting and atrophy, not elsewhere classified, left shoulder: Secondary | ICD-10-CM | POA: Diagnosis not present

## 2022-04-16 DIAGNOSIS — R2681 Unsteadiness on feet: Secondary | ICD-10-CM | POA: Diagnosis not present

## 2022-04-16 DIAGNOSIS — M6259 Muscle wasting and atrophy, not elsewhere classified, multiple sites: Secondary | ICD-10-CM | POA: Diagnosis not present

## 2022-04-17 DIAGNOSIS — R2681 Unsteadiness on feet: Secondary | ICD-10-CM | POA: Diagnosis not present

## 2022-04-17 DIAGNOSIS — M6259 Muscle wasting and atrophy, not elsewhere classified, multiple sites: Secondary | ICD-10-CM | POA: Diagnosis not present

## 2022-04-18 DIAGNOSIS — R2681 Unsteadiness on feet: Secondary | ICD-10-CM | POA: Diagnosis not present

## 2022-04-18 DIAGNOSIS — R296 Repeated falls: Secondary | ICD-10-CM | POA: Diagnosis not present

## 2022-04-18 DIAGNOSIS — M6259 Muscle wasting and atrophy, not elsewhere classified, multiple sites: Secondary | ICD-10-CM | POA: Diagnosis not present

## 2022-04-18 DIAGNOSIS — M62512 Muscle wasting and atrophy, not elsewhere classified, left shoulder: Secondary | ICD-10-CM | POA: Diagnosis not present

## 2022-04-18 DIAGNOSIS — M62511 Muscle wasting and atrophy, not elsewhere classified, right shoulder: Secondary | ICD-10-CM | POA: Diagnosis not present

## 2022-04-20 DIAGNOSIS — M6259 Muscle wasting and atrophy, not elsewhere classified, multiple sites: Secondary | ICD-10-CM | POA: Diagnosis not present

## 2022-04-20 DIAGNOSIS — R296 Repeated falls: Secondary | ICD-10-CM | POA: Diagnosis not present

## 2022-04-20 DIAGNOSIS — M62512 Muscle wasting and atrophy, not elsewhere classified, left shoulder: Secondary | ICD-10-CM | POA: Diagnosis not present

## 2022-04-20 DIAGNOSIS — M62511 Muscle wasting and atrophy, not elsewhere classified, right shoulder: Secondary | ICD-10-CM | POA: Diagnosis not present

## 2022-04-22 DIAGNOSIS — R296 Repeated falls: Secondary | ICD-10-CM | POA: Diagnosis not present

## 2022-04-22 DIAGNOSIS — M6259 Muscle wasting and atrophy, not elsewhere classified, multiple sites: Secondary | ICD-10-CM | POA: Diagnosis not present

## 2022-04-22 DIAGNOSIS — M62512 Muscle wasting and atrophy, not elsewhere classified, left shoulder: Secondary | ICD-10-CM | POA: Diagnosis not present

## 2022-04-22 DIAGNOSIS — R2681 Unsteadiness on feet: Secondary | ICD-10-CM | POA: Diagnosis not present

## 2022-04-22 DIAGNOSIS — M62511 Muscle wasting and atrophy, not elsewhere classified, right shoulder: Secondary | ICD-10-CM | POA: Diagnosis not present

## 2022-04-23 DIAGNOSIS — M6259 Muscle wasting and atrophy, not elsewhere classified, multiple sites: Secondary | ICD-10-CM | POA: Diagnosis not present

## 2022-04-23 DIAGNOSIS — R2681 Unsteadiness on feet: Secondary | ICD-10-CM | POA: Diagnosis not present

## 2022-04-24 DIAGNOSIS — M62512 Muscle wasting and atrophy, not elsewhere classified, left shoulder: Secondary | ICD-10-CM | POA: Diagnosis not present

## 2022-04-24 DIAGNOSIS — M62511 Muscle wasting and atrophy, not elsewhere classified, right shoulder: Secondary | ICD-10-CM | POA: Diagnosis not present

## 2022-04-24 DIAGNOSIS — R2681 Unsteadiness on feet: Secondary | ICD-10-CM | POA: Diagnosis not present

## 2022-04-24 DIAGNOSIS — M6259 Muscle wasting and atrophy, not elsewhere classified, multiple sites: Secondary | ICD-10-CM | POA: Diagnosis not present

## 2022-04-24 DIAGNOSIS — R296 Repeated falls: Secondary | ICD-10-CM | POA: Diagnosis not present

## 2022-04-25 DIAGNOSIS — R2681 Unsteadiness on feet: Secondary | ICD-10-CM | POA: Diagnosis not present

## 2022-04-25 DIAGNOSIS — M62511 Muscle wasting and atrophy, not elsewhere classified, right shoulder: Secondary | ICD-10-CM | POA: Diagnosis not present

## 2022-04-25 DIAGNOSIS — R296 Repeated falls: Secondary | ICD-10-CM | POA: Diagnosis not present

## 2022-04-25 DIAGNOSIS — N390 Urinary tract infection, site not specified: Secondary | ICD-10-CM | POA: Diagnosis not present

## 2022-04-25 DIAGNOSIS — M6259 Muscle wasting and atrophy, not elsewhere classified, multiple sites: Secondary | ICD-10-CM | POA: Diagnosis not present

## 2022-04-25 DIAGNOSIS — M62512 Muscle wasting and atrophy, not elsewhere classified, left shoulder: Secondary | ICD-10-CM | POA: Diagnosis not present

## 2022-04-26 DIAGNOSIS — M6259 Muscle wasting and atrophy, not elsewhere classified, multiple sites: Secondary | ICD-10-CM | POA: Diagnosis not present

## 2022-04-26 DIAGNOSIS — R2681 Unsteadiness on feet: Secondary | ICD-10-CM | POA: Diagnosis not present

## 2022-04-26 DIAGNOSIS — M62512 Muscle wasting and atrophy, not elsewhere classified, left shoulder: Secondary | ICD-10-CM | POA: Diagnosis not present

## 2022-04-26 DIAGNOSIS — M62511 Muscle wasting and atrophy, not elsewhere classified, right shoulder: Secondary | ICD-10-CM | POA: Diagnosis not present

## 2022-04-26 DIAGNOSIS — R296 Repeated falls: Secondary | ICD-10-CM | POA: Diagnosis not present

## 2022-04-29 DIAGNOSIS — B957 Other staphylococcus as the cause of diseases classified elsewhere: Secondary | ICD-10-CM | POA: Diagnosis not present

## 2022-04-29 DIAGNOSIS — R2681 Unsteadiness on feet: Secondary | ICD-10-CM | POA: Diagnosis not present

## 2022-04-29 DIAGNOSIS — R296 Repeated falls: Secondary | ICD-10-CM | POA: Diagnosis not present

## 2022-04-29 DIAGNOSIS — R4182 Altered mental status, unspecified: Secondary | ICD-10-CM | POA: Diagnosis not present

## 2022-04-29 DIAGNOSIS — Z87440 Personal history of urinary (tract) infections: Secondary | ICD-10-CM | POA: Diagnosis not present

## 2022-04-29 DIAGNOSIS — M6259 Muscle wasting and atrophy, not elsewhere classified, multiple sites: Secondary | ICD-10-CM | POA: Diagnosis not present

## 2022-04-29 DIAGNOSIS — N390 Urinary tract infection, site not specified: Secondary | ICD-10-CM | POA: Diagnosis not present

## 2022-04-29 DIAGNOSIS — M62512 Muscle wasting and atrophy, not elsewhere classified, left shoulder: Secondary | ICD-10-CM | POA: Diagnosis not present

## 2022-04-29 DIAGNOSIS — M62511 Muscle wasting and atrophy, not elsewhere classified, right shoulder: Secondary | ICD-10-CM | POA: Diagnosis not present

## 2022-04-30 DIAGNOSIS — M62511 Muscle wasting and atrophy, not elsewhere classified, right shoulder: Secondary | ICD-10-CM | POA: Diagnosis not present

## 2022-04-30 DIAGNOSIS — M62512 Muscle wasting and atrophy, not elsewhere classified, left shoulder: Secondary | ICD-10-CM | POA: Diagnosis not present

## 2022-04-30 DIAGNOSIS — M6259 Muscle wasting and atrophy, not elsewhere classified, multiple sites: Secondary | ICD-10-CM | POA: Diagnosis not present

## 2022-04-30 DIAGNOSIS — R296 Repeated falls: Secondary | ICD-10-CM | POA: Diagnosis not present

## 2022-04-30 DIAGNOSIS — R2681 Unsteadiness on feet: Secondary | ICD-10-CM | POA: Diagnosis not present

## 2022-05-01 DIAGNOSIS — M6259 Muscle wasting and atrophy, not elsewhere classified, multiple sites: Secondary | ICD-10-CM | POA: Diagnosis not present

## 2022-05-01 DIAGNOSIS — R2681 Unsteadiness on feet: Secondary | ICD-10-CM | POA: Diagnosis not present

## 2022-05-02 DIAGNOSIS — M6259 Muscle wasting and atrophy, not elsewhere classified, multiple sites: Secondary | ICD-10-CM | POA: Diagnosis not present

## 2022-05-02 DIAGNOSIS — M62512 Muscle wasting and atrophy, not elsewhere classified, left shoulder: Secondary | ICD-10-CM | POA: Diagnosis not present

## 2022-05-02 DIAGNOSIS — R296 Repeated falls: Secondary | ICD-10-CM | POA: Diagnosis not present

## 2022-05-02 DIAGNOSIS — R2681 Unsteadiness on feet: Secondary | ICD-10-CM | POA: Diagnosis not present

## 2022-05-02 DIAGNOSIS — M62511 Muscle wasting and atrophy, not elsewhere classified, right shoulder: Secondary | ICD-10-CM | POA: Diagnosis not present

## 2022-05-03 DIAGNOSIS — R2681 Unsteadiness on feet: Secondary | ICD-10-CM | POA: Diagnosis not present

## 2022-05-03 DIAGNOSIS — M6259 Muscle wasting and atrophy, not elsewhere classified, multiple sites: Secondary | ICD-10-CM | POA: Diagnosis not present

## 2022-05-04 DIAGNOSIS — M62511 Muscle wasting and atrophy, not elsewhere classified, right shoulder: Secondary | ICD-10-CM | POA: Diagnosis not present

## 2022-05-04 DIAGNOSIS — R296 Repeated falls: Secondary | ICD-10-CM | POA: Diagnosis not present

## 2022-05-04 DIAGNOSIS — M62512 Muscle wasting and atrophy, not elsewhere classified, left shoulder: Secondary | ICD-10-CM | POA: Diagnosis not present

## 2022-05-04 DIAGNOSIS — M6259 Muscle wasting and atrophy, not elsewhere classified, multiple sites: Secondary | ICD-10-CM | POA: Diagnosis not present

## 2022-05-07 DIAGNOSIS — M6259 Muscle wasting and atrophy, not elsewhere classified, multiple sites: Secondary | ICD-10-CM | POA: Diagnosis not present

## 2022-05-07 DIAGNOSIS — R2681 Unsteadiness on feet: Secondary | ICD-10-CM | POA: Diagnosis not present

## 2022-05-09 DIAGNOSIS — R2681 Unsteadiness on feet: Secondary | ICD-10-CM | POA: Diagnosis not present

## 2022-05-09 DIAGNOSIS — M6259 Muscle wasting and atrophy, not elsewhere classified, multiple sites: Secondary | ICD-10-CM | POA: Diagnosis not present

## 2022-05-10 DIAGNOSIS — M6259 Muscle wasting and atrophy, not elsewhere classified, multiple sites: Secondary | ICD-10-CM | POA: Diagnosis not present

## 2022-05-10 DIAGNOSIS — R2681 Unsteadiness on feet: Secondary | ICD-10-CM | POA: Diagnosis not present

## 2022-05-13 DIAGNOSIS — R451 Restlessness and agitation: Secondary | ICD-10-CM | POA: Diagnosis not present

## 2022-05-13 DIAGNOSIS — F03918 Unspecified dementia, unspecified severity, with other behavioral disturbance: Secondary | ICD-10-CM | POA: Diagnosis not present

## 2022-05-13 DIAGNOSIS — F03C11 Unspecified dementia, severe, with agitation: Secondary | ICD-10-CM | POA: Diagnosis not present

## 2022-05-13 DIAGNOSIS — M6259 Muscle wasting and atrophy, not elsewhere classified, multiple sites: Secondary | ICD-10-CM | POA: Diagnosis not present

## 2022-05-13 DIAGNOSIS — R2681 Unsteadiness on feet: Secondary | ICD-10-CM | POA: Diagnosis not present

## 2022-05-14 DIAGNOSIS — R2681 Unsteadiness on feet: Secondary | ICD-10-CM | POA: Diagnosis not present

## 2022-05-14 DIAGNOSIS — M6259 Muscle wasting and atrophy, not elsewhere classified, multiple sites: Secondary | ICD-10-CM | POA: Diagnosis not present

## 2022-05-15 DIAGNOSIS — R2681 Unsteadiness on feet: Secondary | ICD-10-CM | POA: Diagnosis not present

## 2022-05-15 DIAGNOSIS — E7800 Pure hypercholesterolemia, unspecified: Secondary | ICD-10-CM | POA: Diagnosis not present

## 2022-05-15 DIAGNOSIS — M6259 Muscle wasting and atrophy, not elsewhere classified, multiple sites: Secondary | ICD-10-CM | POA: Diagnosis not present

## 2022-05-15 DIAGNOSIS — Z79899 Other long term (current) drug therapy: Secondary | ICD-10-CM | POA: Diagnosis not present

## 2022-05-15 DIAGNOSIS — N390 Urinary tract infection, site not specified: Secondary | ICD-10-CM | POA: Diagnosis not present

## 2022-05-15 DIAGNOSIS — I10 Essential (primary) hypertension: Secondary | ICD-10-CM | POA: Diagnosis not present

## 2022-05-16 DIAGNOSIS — M6259 Muscle wasting and atrophy, not elsewhere classified, multiple sites: Secondary | ICD-10-CM | POA: Diagnosis not present

## 2022-05-16 DIAGNOSIS — R2681 Unsteadiness on feet: Secondary | ICD-10-CM | POA: Diagnosis not present

## 2022-05-27 DIAGNOSIS — R42 Dizziness and giddiness: Secondary | ICD-10-CM | POA: Diagnosis not present

## 2022-05-27 DIAGNOSIS — F0390 Unspecified dementia without behavioral disturbance: Secondary | ICD-10-CM | POA: Diagnosis not present

## 2022-05-27 DIAGNOSIS — N390 Urinary tract infection, site not specified: Secondary | ICD-10-CM | POA: Diagnosis not present

## 2022-05-27 DIAGNOSIS — Z87440 Personal history of urinary (tract) infections: Secondary | ICD-10-CM | POA: Diagnosis not present

## 2022-05-27 DIAGNOSIS — Z9183 Wandering in diseases classified elsewhere: Secondary | ICD-10-CM | POA: Diagnosis not present

## 2022-05-29 DIAGNOSIS — Z87440 Personal history of urinary (tract) infections: Secondary | ICD-10-CM | POA: Diagnosis not present

## 2022-05-29 DIAGNOSIS — B3749 Other urogenital candidiasis: Secondary | ICD-10-CM | POA: Diagnosis not present

## 2022-05-29 DIAGNOSIS — F03918 Unspecified dementia, unspecified severity, with other behavioral disturbance: Secondary | ICD-10-CM | POA: Diagnosis not present

## 2022-05-31 DIAGNOSIS — Z9181 History of falling: Secondary | ICD-10-CM | POA: Diagnosis not present

## 2022-05-31 DIAGNOSIS — F0390 Unspecified dementia without behavioral disturbance: Secondary | ICD-10-CM | POA: Diagnosis not present

## 2022-05-31 DIAGNOSIS — W19XXXA Unspecified fall, initial encounter: Secondary | ICD-10-CM | POA: Diagnosis not present

## 2022-05-31 DIAGNOSIS — M6281 Muscle weakness (generalized): Secondary | ICD-10-CM | POA: Diagnosis not present

## 2022-05-31 DIAGNOSIS — R2689 Other abnormalities of gait and mobility: Secondary | ICD-10-CM | POA: Diagnosis not present

## 2022-05-31 DIAGNOSIS — R269 Unspecified abnormalities of gait and mobility: Secondary | ICD-10-CM | POA: Diagnosis not present

## 2022-06-12 DIAGNOSIS — R451 Restlessness and agitation: Secondary | ICD-10-CM | POA: Diagnosis not present

## 2022-06-12 DIAGNOSIS — F03C11 Unspecified dementia, severe, with agitation: Secondary | ICD-10-CM | POA: Diagnosis not present

## 2022-06-26 DIAGNOSIS — F0390 Unspecified dementia without behavioral disturbance: Secondary | ICD-10-CM | POA: Diagnosis not present

## 2022-06-26 DIAGNOSIS — B351 Tinea unguium: Secondary | ICD-10-CM | POA: Diagnosis not present

## 2022-06-26 DIAGNOSIS — L989 Disorder of the skin and subcutaneous tissue, unspecified: Secondary | ICD-10-CM | POA: Diagnosis not present

## 2022-07-03 DIAGNOSIS — F03918 Unspecified dementia, unspecified severity, with other behavioral disturbance: Secondary | ICD-10-CM

## 2022-07-03 DIAGNOSIS — I119 Hypertensive heart disease without heart failure: Secondary | ICD-10-CM | POA: Diagnosis not present

## 2022-07-03 DIAGNOSIS — K219 Gastro-esophageal reflux disease without esophagitis: Secondary | ICD-10-CM | POA: Diagnosis not present

## 2022-07-03 DIAGNOSIS — J449 Chronic obstructive pulmonary disease, unspecified: Secondary | ICD-10-CM | POA: Diagnosis not present

## 2022-07-03 DIAGNOSIS — M1990 Unspecified osteoarthritis, unspecified site: Secondary | ICD-10-CM

## 2022-07-03 DIAGNOSIS — R7303 Prediabetes: Secondary | ICD-10-CM | POA: Diagnosis not present

## 2022-07-03 DIAGNOSIS — G894 Chronic pain syndrome: Secondary | ICD-10-CM

## 2022-07-04 DIAGNOSIS — I10 Essential (primary) hypertension: Secondary | ICD-10-CM | POA: Diagnosis not present

## 2022-07-04 DIAGNOSIS — E119 Type 2 diabetes mellitus without complications: Secondary | ICD-10-CM | POA: Diagnosis not present

## 2022-07-04 DIAGNOSIS — D649 Anemia, unspecified: Secondary | ICD-10-CM | POA: Diagnosis not present

## 2022-07-17 DIAGNOSIS — F03C11 Unspecified dementia, severe, with agitation: Secondary | ICD-10-CM | POA: Diagnosis not present

## 2022-07-17 DIAGNOSIS — R451 Restlessness and agitation: Secondary | ICD-10-CM | POA: Diagnosis not present

## 2022-07-22 ENCOUNTER — Telehealth: Payer: Self-pay

## 2022-07-22 DIAGNOSIS — F0390 Unspecified dementia without behavioral disturbance: Secondary | ICD-10-CM | POA: Diagnosis not present

## 2022-07-22 DIAGNOSIS — J449 Chronic obstructive pulmonary disease, unspecified: Secondary | ICD-10-CM | POA: Diagnosis not present

## 2022-07-22 DIAGNOSIS — R5381 Other malaise: Secondary | ICD-10-CM | POA: Diagnosis not present

## 2022-07-22 DIAGNOSIS — R059 Cough, unspecified: Secondary | ICD-10-CM | POA: Diagnosis not present

## 2022-07-22 NOTE — Patient Outreach (Signed)
  Care Coordination   Initial Visit Note   07/22/2022 Name: Nicole Patton MRN: [id number] DOB: May 20, 1945  Nicole Patton is a 77 y.o. year old female who sees Nicole Northern, MD for primary care. I  placed call to listed phone number. [facility])  What matters to the patients health and wellness today?  Placed call to patient and spoke with [name] where patient is a current resident.     SDOH assessments and interventions completed:  No     Care Coordination Interventions:  No, not indicated   Follow up plan: No further intervention required.   Encounter Outcome:  Pt. Visit Completed   Nicole Pavy, RN, BSN, CEN [facility] Coordinator [phone]

## 2022-07-23 DIAGNOSIS — J9809 Other diseases of bronchus, not elsewhere classified: Secondary | ICD-10-CM | POA: Diagnosis not present

## 2022-07-23 DIAGNOSIS — J439 Emphysema, unspecified: Secondary | ICD-10-CM | POA: Diagnosis not present

## 2022-07-26 ENCOUNTER — Other Ambulatory Visit: Payer: Self-pay | Admitting: Internal Medicine

## 2022-08-07 DIAGNOSIS — F03918 Unspecified dementia, unspecified severity, with other behavioral disturbance: Secondary | ICD-10-CM | POA: Diagnosis not present

## 2022-08-07 DIAGNOSIS — R634 Abnormal weight loss: Secondary | ICD-10-CM | POA: Diagnosis not present

## 2022-08-23 DIAGNOSIS — M62511 Muscle wasting and atrophy, not elsewhere classified, right shoulder: Secondary | ICD-10-CM | POA: Diagnosis not present

## 2022-08-23 DIAGNOSIS — R296 Repeated falls: Secondary | ICD-10-CM | POA: Diagnosis not present

## 2022-08-23 DIAGNOSIS — M62512 Muscle wasting and atrophy, not elsewhere classified, left shoulder: Secondary | ICD-10-CM | POA: Diagnosis not present

## 2022-08-23 DIAGNOSIS — R278 Other lack of coordination: Secondary | ICD-10-CM | POA: Diagnosis not present

## 2022-08-26 DIAGNOSIS — M62511 Muscle wasting and atrophy, not elsewhere classified, right shoulder: Secondary | ICD-10-CM | POA: Diagnosis not present

## 2022-08-26 DIAGNOSIS — M62512 Muscle wasting and atrophy, not elsewhere classified, left shoulder: Secondary | ICD-10-CM | POA: Diagnosis not present

## 2022-08-26 DIAGNOSIS — R278 Other lack of coordination: Secondary | ICD-10-CM | POA: Diagnosis not present

## 2022-08-26 DIAGNOSIS — R296 Repeated falls: Secondary | ICD-10-CM | POA: Diagnosis not present

## 2022-08-27 DIAGNOSIS — M62512 Muscle wasting and atrophy, not elsewhere classified, left shoulder: Secondary | ICD-10-CM | POA: Diagnosis not present

## 2022-08-27 DIAGNOSIS — R278 Other lack of coordination: Secondary | ICD-10-CM | POA: Diagnosis not present

## 2022-08-27 DIAGNOSIS — R296 Repeated falls: Secondary | ICD-10-CM | POA: Diagnosis not present

## 2022-08-27 DIAGNOSIS — M62511 Muscle wasting and atrophy, not elsewhere classified, right shoulder: Secondary | ICD-10-CM | POA: Diagnosis not present

## 2022-08-28 DIAGNOSIS — M62511 Muscle wasting and atrophy, not elsewhere classified, right shoulder: Secondary | ICD-10-CM | POA: Diagnosis not present

## 2022-08-28 DIAGNOSIS — M62512 Muscle wasting and atrophy, not elsewhere classified, left shoulder: Secondary | ICD-10-CM | POA: Diagnosis not present

## 2022-08-28 DIAGNOSIS — R278 Other lack of coordination: Secondary | ICD-10-CM | POA: Diagnosis not present

## 2022-08-28 DIAGNOSIS — R296 Repeated falls: Secondary | ICD-10-CM | POA: Diagnosis not present

## 2022-09-02 DIAGNOSIS — M62512 Muscle wasting and atrophy, not elsewhere classified, left shoulder: Secondary | ICD-10-CM | POA: Diagnosis not present

## 2022-09-02 DIAGNOSIS — R296 Repeated falls: Secondary | ICD-10-CM | POA: Diagnosis not present

## 2022-09-02 DIAGNOSIS — F03918 Unspecified dementia, unspecified severity, with other behavioral disturbance: Secondary | ICD-10-CM | POA: Diagnosis not present

## 2022-09-02 DIAGNOSIS — R630 Anorexia: Secondary | ICD-10-CM | POA: Diagnosis not present

## 2022-09-02 DIAGNOSIS — M62511 Muscle wasting and atrophy, not elsewhere classified, right shoulder: Secondary | ICD-10-CM | POA: Diagnosis not present

## 2022-09-02 DIAGNOSIS — R278 Other lack of coordination: Secondary | ICD-10-CM | POA: Diagnosis not present

## 2022-09-02 DIAGNOSIS — R634 Abnormal weight loss: Secondary | ICD-10-CM | POA: Diagnosis not present

## 2022-09-04 DIAGNOSIS — M62511 Muscle wasting and atrophy, not elsewhere classified, right shoulder: Secondary | ICD-10-CM | POA: Diagnosis not present

## 2022-09-04 DIAGNOSIS — R296 Repeated falls: Secondary | ICD-10-CM | POA: Diagnosis not present

## 2022-09-04 DIAGNOSIS — R278 Other lack of coordination: Secondary | ICD-10-CM | POA: Diagnosis not present

## 2022-09-04 DIAGNOSIS — M62512 Muscle wasting and atrophy, not elsewhere classified, left shoulder: Secondary | ICD-10-CM | POA: Diagnosis not present

## 2022-09-05 DIAGNOSIS — R296 Repeated falls: Secondary | ICD-10-CM | POA: Diagnosis not present

## 2022-09-05 DIAGNOSIS — R278 Other lack of coordination: Secondary | ICD-10-CM | POA: Diagnosis not present

## 2022-09-05 DIAGNOSIS — M62511 Muscle wasting and atrophy, not elsewhere classified, right shoulder: Secondary | ICD-10-CM | POA: Diagnosis not present

## 2022-09-05 DIAGNOSIS — M62512 Muscle wasting and atrophy, not elsewhere classified, left shoulder: Secondary | ICD-10-CM | POA: Diagnosis not present

## 2022-09-08 DIAGNOSIS — R278 Other lack of coordination: Secondary | ICD-10-CM | POA: Diagnosis not present

## 2022-09-08 DIAGNOSIS — R296 Repeated falls: Secondary | ICD-10-CM | POA: Diagnosis not present

## 2022-09-08 DIAGNOSIS — M62512 Muscle wasting and atrophy, not elsewhere classified, left shoulder: Secondary | ICD-10-CM | POA: Diagnosis not present

## 2022-09-08 DIAGNOSIS — M62511 Muscle wasting and atrophy, not elsewhere classified, right shoulder: Secondary | ICD-10-CM | POA: Diagnosis not present

## 2022-09-09 DIAGNOSIS — R296 Repeated falls: Secondary | ICD-10-CM | POA: Diagnosis not present

## 2022-09-09 DIAGNOSIS — M62511 Muscle wasting and atrophy, not elsewhere classified, right shoulder: Secondary | ICD-10-CM | POA: Diagnosis not present

## 2022-09-09 DIAGNOSIS — R278 Other lack of coordination: Secondary | ICD-10-CM | POA: Diagnosis not present

## 2022-09-09 DIAGNOSIS — M62512 Muscle wasting and atrophy, not elsewhere classified, left shoulder: Secondary | ICD-10-CM | POA: Diagnosis not present

## 2022-09-10 DIAGNOSIS — M62511 Muscle wasting and atrophy, not elsewhere classified, right shoulder: Secondary | ICD-10-CM | POA: Diagnosis not present

## 2022-09-10 DIAGNOSIS — R296 Repeated falls: Secondary | ICD-10-CM | POA: Diagnosis not present

## 2022-09-10 DIAGNOSIS — M62512 Muscle wasting and atrophy, not elsewhere classified, left shoulder: Secondary | ICD-10-CM | POA: Diagnosis not present

## 2022-09-10 DIAGNOSIS — R278 Other lack of coordination: Secondary | ICD-10-CM | POA: Diagnosis not present

## 2022-09-11 DIAGNOSIS — M62511 Muscle wasting and atrophy, not elsewhere classified, right shoulder: Secondary | ICD-10-CM | POA: Diagnosis not present

## 2022-09-11 DIAGNOSIS — R296 Repeated falls: Secondary | ICD-10-CM | POA: Diagnosis not present

## 2022-09-11 DIAGNOSIS — M62512 Muscle wasting and atrophy, not elsewhere classified, left shoulder: Secondary | ICD-10-CM | POA: Diagnosis not present

## 2022-09-11 DIAGNOSIS — R278 Other lack of coordination: Secondary | ICD-10-CM | POA: Diagnosis not present

## 2022-09-12 DIAGNOSIS — R278 Other lack of coordination: Secondary | ICD-10-CM | POA: Diagnosis not present

## 2022-09-12 DIAGNOSIS — M62511 Muscle wasting and atrophy, not elsewhere classified, right shoulder: Secondary | ICD-10-CM | POA: Diagnosis not present

## 2022-09-12 DIAGNOSIS — R296 Repeated falls: Secondary | ICD-10-CM | POA: Diagnosis not present

## 2022-09-12 DIAGNOSIS — M62512 Muscle wasting and atrophy, not elsewhere classified, left shoulder: Secondary | ICD-10-CM | POA: Diagnosis not present

## 2022-09-17 DIAGNOSIS — R296 Repeated falls: Secondary | ICD-10-CM | POA: Diagnosis not present

## 2022-09-17 DIAGNOSIS — M62511 Muscle wasting and atrophy, not elsewhere classified, right shoulder: Secondary | ICD-10-CM | POA: Diagnosis not present

## 2022-09-17 DIAGNOSIS — R278 Other lack of coordination: Secondary | ICD-10-CM | POA: Diagnosis not present

## 2022-09-17 DIAGNOSIS — M62512 Muscle wasting and atrophy, not elsewhere classified, left shoulder: Secondary | ICD-10-CM | POA: Diagnosis not present

## 2022-09-18 DIAGNOSIS — F03C11 Unspecified dementia, severe, with agitation: Secondary | ICD-10-CM | POA: Diagnosis not present

## 2022-09-18 DIAGNOSIS — R451 Restlessness and agitation: Secondary | ICD-10-CM | POA: Diagnosis not present

## 2022-09-20 DIAGNOSIS — R296 Repeated falls: Secondary | ICD-10-CM | POA: Diagnosis not present

## 2022-09-20 DIAGNOSIS — M62511 Muscle wasting and atrophy, not elsewhere classified, right shoulder: Secondary | ICD-10-CM | POA: Diagnosis not present

## 2022-09-20 DIAGNOSIS — M62512 Muscle wasting and atrophy, not elsewhere classified, left shoulder: Secondary | ICD-10-CM | POA: Diagnosis not present

## 2022-09-20 DIAGNOSIS — R278 Other lack of coordination: Secondary | ICD-10-CM | POA: Diagnosis not present

## 2022-09-23 DIAGNOSIS — R296 Repeated falls: Secondary | ICD-10-CM | POA: Diagnosis not present

## 2022-09-23 DIAGNOSIS — M62512 Muscle wasting and atrophy, not elsewhere classified, left shoulder: Secondary | ICD-10-CM | POA: Diagnosis not present

## 2022-09-23 DIAGNOSIS — F02C0 Dementia in other diseases classified elsewhere, severe, without behavioral disturbance, psychotic disturbance, mood disturbance, and anxiety: Secondary | ICD-10-CM | POA: Diagnosis not present

## 2022-09-23 DIAGNOSIS — R278 Other lack of coordination: Secondary | ICD-10-CM | POA: Diagnosis not present

## 2022-09-23 DIAGNOSIS — L821 Other seborrheic keratosis: Secondary | ICD-10-CM | POA: Diagnosis not present

## 2022-09-23 DIAGNOSIS — D229 Melanocytic nevi, unspecified: Secondary | ICD-10-CM | POA: Diagnosis not present

## 2022-09-23 DIAGNOSIS — R8290 Unspecified abnormal findings in urine: Secondary | ICD-10-CM | POA: Diagnosis not present

## 2022-09-23 DIAGNOSIS — M62511 Muscle wasting and atrophy, not elsewhere classified, right shoulder: Secondary | ICD-10-CM | POA: Diagnosis not present

## 2022-10-16 DIAGNOSIS — R451 Restlessness and agitation: Secondary | ICD-10-CM | POA: Diagnosis not present

## 2022-10-16 DIAGNOSIS — R634 Abnormal weight loss: Secondary | ICD-10-CM | POA: Diagnosis not present

## 2022-10-16 DIAGNOSIS — F0390 Unspecified dementia without behavioral disturbance: Secondary | ICD-10-CM | POA: Diagnosis not present

## 2022-10-31 DIAGNOSIS — Z87440 Personal history of urinary (tract) infections: Secondary | ICD-10-CM | POA: Diagnosis not present

## 2022-10-31 DIAGNOSIS — R8290 Unspecified abnormal findings in urine: Secondary | ICD-10-CM | POA: Diagnosis not present

## 2022-10-31 DIAGNOSIS — R2689 Other abnormalities of gait and mobility: Secondary | ICD-10-CM | POA: Diagnosis not present

## 2022-10-31 DIAGNOSIS — F03918 Unspecified dementia, unspecified severity, with other behavioral disturbance: Secondary | ICD-10-CM

## 2022-10-31 DIAGNOSIS — W19XXXA Unspecified fall, initial encounter: Secondary | ICD-10-CM | POA: Diagnosis not present

## 2022-10-31 DIAGNOSIS — M6281 Muscle weakness (generalized): Secondary | ICD-10-CM | POA: Diagnosis not present

## 2022-10-31 DIAGNOSIS — S0083XA Contusion of other part of head, initial encounter: Secondary | ICD-10-CM | POA: Diagnosis not present

## 2022-10-31 DIAGNOSIS — I10 Essential (primary) hypertension: Secondary | ICD-10-CM | POA: Diagnosis not present

## 2022-10-31 DIAGNOSIS — Z9181 History of falling: Secondary | ICD-10-CM | POA: Diagnosis not present

## 2022-11-01 DIAGNOSIS — D649 Anemia, unspecified: Secondary | ICD-10-CM | POA: Diagnosis not present

## 2022-11-01 DIAGNOSIS — I10 Essential (primary) hypertension: Secondary | ICD-10-CM | POA: Diagnosis not present

## 2022-11-05 DIAGNOSIS — N390 Urinary tract infection, site not specified: Secondary | ICD-10-CM | POA: Diagnosis not present

## 2022-11-10 DIAGNOSIS — R296 Repeated falls: Secondary | ICD-10-CM | POA: Diagnosis not present

## 2022-11-10 DIAGNOSIS — R278 Other lack of coordination: Secondary | ICD-10-CM | POA: Diagnosis not present

## 2022-11-13 DIAGNOSIS — R451 Restlessness and agitation: Secondary | ICD-10-CM | POA: Diagnosis not present

## 2022-11-15 DIAGNOSIS — M62552 Muscle wasting and atrophy, not elsewhere classified, left thigh: Secondary | ICD-10-CM | POA: Diagnosis not present

## 2022-11-15 DIAGNOSIS — Z23 Encounter for immunization: Secondary | ICD-10-CM | POA: Diagnosis not present

## 2022-11-15 DIAGNOSIS — M25562 Pain in left knee: Secondary | ICD-10-CM | POA: Diagnosis not present

## 2022-11-15 DIAGNOSIS — R2681 Unsteadiness on feet: Secondary | ICD-10-CM | POA: Diagnosis not present

## 2022-11-15 DIAGNOSIS — M62551 Muscle wasting and atrophy, not elsewhere classified, right thigh: Secondary | ICD-10-CM | POA: Diagnosis not present

## 2022-11-18 DIAGNOSIS — R2681 Unsteadiness on feet: Secondary | ICD-10-CM | POA: Diagnosis not present

## 2022-11-18 DIAGNOSIS — M25562 Pain in left knee: Secondary | ICD-10-CM | POA: Diagnosis not present

## 2022-11-18 DIAGNOSIS — M62552 Muscle wasting and atrophy, not elsewhere classified, left thigh: Secondary | ICD-10-CM | POA: Diagnosis not present

## 2022-11-18 DIAGNOSIS — M62551 Muscle wasting and atrophy, not elsewhere classified, right thigh: Secondary | ICD-10-CM | POA: Diagnosis not present

## 2022-11-21 DIAGNOSIS — R2681 Unsteadiness on feet: Secondary | ICD-10-CM | POA: Diagnosis not present

## 2022-11-21 DIAGNOSIS — F03911 Unspecified dementia, unspecified severity, with agitation: Secondary | ICD-10-CM | POA: Diagnosis not present

## 2022-11-21 DIAGNOSIS — M25562 Pain in left knee: Secondary | ICD-10-CM | POA: Diagnosis not present

## 2022-11-21 DIAGNOSIS — M62552 Muscle wasting and atrophy, not elsewhere classified, left thigh: Secondary | ICD-10-CM | POA: Diagnosis not present

## 2022-11-21 DIAGNOSIS — J449 Chronic obstructive pulmonary disease, unspecified: Secondary | ICD-10-CM | POA: Diagnosis not present

## 2022-11-21 DIAGNOSIS — Z87440 Personal history of urinary (tract) infections: Secondary | ICD-10-CM | POA: Diagnosis not present

## 2022-11-21 DIAGNOSIS — M62551 Muscle wasting and atrophy, not elsewhere classified, right thigh: Secondary | ICD-10-CM | POA: Diagnosis not present

## 2022-11-22 DIAGNOSIS — M25562 Pain in left knee: Secondary | ICD-10-CM | POA: Diagnosis not present

## 2022-11-22 DIAGNOSIS — M62552 Muscle wasting and atrophy, not elsewhere classified, left thigh: Secondary | ICD-10-CM | POA: Diagnosis not present

## 2022-11-22 DIAGNOSIS — M62551 Muscle wasting and atrophy, not elsewhere classified, right thigh: Secondary | ICD-10-CM | POA: Diagnosis not present

## 2022-11-22 DIAGNOSIS — R2681 Unsteadiness on feet: Secondary | ICD-10-CM | POA: Diagnosis not present

## 2022-11-25 DIAGNOSIS — I10 Essential (primary) hypertension: Secondary | ICD-10-CM | POA: Diagnosis not present

## 2022-11-25 DIAGNOSIS — D649 Anemia, unspecified: Secondary | ICD-10-CM | POA: Diagnosis not present

## 2022-11-26 DIAGNOSIS — M62551 Muscle wasting and atrophy, not elsewhere classified, right thigh: Secondary | ICD-10-CM | POA: Diagnosis not present

## 2022-11-26 DIAGNOSIS — R2681 Unsteadiness on feet: Secondary | ICD-10-CM | POA: Diagnosis not present

## 2022-11-26 DIAGNOSIS — M25562 Pain in left knee: Secondary | ICD-10-CM | POA: Diagnosis not present

## 2022-11-26 DIAGNOSIS — M62552 Muscle wasting and atrophy, not elsewhere classified, left thigh: Secondary | ICD-10-CM | POA: Diagnosis not present

## 2022-11-27 DIAGNOSIS — M6281 Muscle weakness (generalized): Secondary | ICD-10-CM | POA: Diagnosis not present

## 2022-11-27 DIAGNOSIS — Z9183 Wandering in diseases classified elsewhere: Secondary | ICD-10-CM | POA: Diagnosis not present

## 2022-11-27 DIAGNOSIS — R451 Restlessness and agitation: Secondary | ICD-10-CM | POA: Diagnosis not present

## 2022-11-27 DIAGNOSIS — F03918 Unspecified dementia, unspecified severity, with other behavioral disturbance: Secondary | ICD-10-CM | POA: Diagnosis not present

## 2022-11-27 DIAGNOSIS — Z9181 History of falling: Secondary | ICD-10-CM | POA: Diagnosis not present

## 2022-11-28 DIAGNOSIS — R2681 Unsteadiness on feet: Secondary | ICD-10-CM | POA: Diagnosis not present

## 2022-11-28 DIAGNOSIS — M25562 Pain in left knee: Secondary | ICD-10-CM | POA: Diagnosis not present

## 2022-11-28 DIAGNOSIS — M62551 Muscle wasting and atrophy, not elsewhere classified, right thigh: Secondary | ICD-10-CM | POA: Diagnosis not present

## 2022-11-28 DIAGNOSIS — M62552 Muscle wasting and atrophy, not elsewhere classified, left thigh: Secondary | ICD-10-CM | POA: Diagnosis not present

## 2022-11-29 DIAGNOSIS — M6281 Muscle weakness (generalized): Secondary | ICD-10-CM | POA: Diagnosis not present

## 2022-11-29 DIAGNOSIS — M25562 Pain in left knee: Secondary | ICD-10-CM | POA: Diagnosis not present

## 2022-11-29 DIAGNOSIS — M1990 Unspecified osteoarthritis, unspecified site: Secondary | ICD-10-CM | POA: Diagnosis not present

## 2022-11-29 DIAGNOSIS — F03918 Unspecified dementia, unspecified severity, with other behavioral disturbance: Secondary | ICD-10-CM

## 2022-11-29 DIAGNOSIS — M62552 Muscle wasting and atrophy, not elsewhere classified, left thigh: Secondary | ICD-10-CM | POA: Diagnosis not present

## 2022-11-29 DIAGNOSIS — R7303 Prediabetes: Secondary | ICD-10-CM | POA: Diagnosis not present

## 2022-11-29 DIAGNOSIS — K219 Gastro-esophageal reflux disease without esophagitis: Secondary | ICD-10-CM | POA: Diagnosis not present

## 2022-11-29 DIAGNOSIS — J449 Chronic obstructive pulmonary disease, unspecified: Secondary | ICD-10-CM | POA: Diagnosis not present

## 2022-11-29 DIAGNOSIS — I119 Hypertensive heart disease without heart failure: Secondary | ICD-10-CM | POA: Diagnosis not present

## 2022-11-29 DIAGNOSIS — R2681 Unsteadiness on feet: Secondary | ICD-10-CM | POA: Diagnosis not present

## 2022-11-29 DIAGNOSIS — M62551 Muscle wasting and atrophy, not elsewhere classified, right thigh: Secondary | ICD-10-CM | POA: Diagnosis not present

## 2022-11-29 DIAGNOSIS — G894 Chronic pain syndrome: Secondary | ICD-10-CM | POA: Diagnosis not present

## 2022-12-02 DIAGNOSIS — M1712 Unilateral primary osteoarthritis, left knee: Secondary | ICD-10-CM | POA: Diagnosis not present

## 2022-12-02 DIAGNOSIS — E119 Type 2 diabetes mellitus without complications: Secondary | ICD-10-CM | POA: Diagnosis not present

## 2022-12-02 DIAGNOSIS — D649 Anemia, unspecified: Secondary | ICD-10-CM | POA: Diagnosis not present

## 2022-12-02 DIAGNOSIS — Z79899 Other long term (current) drug therapy: Secondary | ICD-10-CM | POA: Diagnosis not present

## 2022-12-02 DIAGNOSIS — Z13820 Encounter for screening for osteoporosis: Secondary | ICD-10-CM | POA: Diagnosis not present

## 2022-12-02 DIAGNOSIS — D509 Iron deficiency anemia, unspecified: Secondary | ICD-10-CM | POA: Diagnosis not present

## 2022-12-03 DIAGNOSIS — M62552 Muscle wasting and atrophy, not elsewhere classified, left thigh: Secondary | ICD-10-CM | POA: Diagnosis not present

## 2022-12-03 DIAGNOSIS — M62551 Muscle wasting and atrophy, not elsewhere classified, right thigh: Secondary | ICD-10-CM | POA: Diagnosis not present

## 2022-12-03 DIAGNOSIS — M25562 Pain in left knee: Secondary | ICD-10-CM | POA: Diagnosis not present

## 2022-12-03 DIAGNOSIS — R2681 Unsteadiness on feet: Secondary | ICD-10-CM | POA: Diagnosis not present

## 2022-12-04 DIAGNOSIS — R2681 Unsteadiness on feet: Secondary | ICD-10-CM | POA: Diagnosis not present

## 2022-12-04 DIAGNOSIS — M62552 Muscle wasting and atrophy, not elsewhere classified, left thigh: Secondary | ICD-10-CM | POA: Diagnosis not present

## 2022-12-04 DIAGNOSIS — M62551 Muscle wasting and atrophy, not elsewhere classified, right thigh: Secondary | ICD-10-CM | POA: Diagnosis not present

## 2022-12-04 DIAGNOSIS — M25562 Pain in left knee: Secondary | ICD-10-CM | POA: Diagnosis not present

## 2022-12-05 DIAGNOSIS — M25562 Pain in left knee: Secondary | ICD-10-CM | POA: Diagnosis not present

## 2022-12-05 DIAGNOSIS — R2681 Unsteadiness on feet: Secondary | ICD-10-CM | POA: Diagnosis not present

## 2022-12-05 DIAGNOSIS — M62551 Muscle wasting and atrophy, not elsewhere classified, right thigh: Secondary | ICD-10-CM | POA: Diagnosis not present

## 2022-12-05 DIAGNOSIS — M62552 Muscle wasting and atrophy, not elsewhere classified, left thigh: Secondary | ICD-10-CM | POA: Diagnosis not present

## 2022-12-12 DIAGNOSIS — M62551 Muscle wasting and atrophy, not elsewhere classified, right thigh: Secondary | ICD-10-CM | POA: Diagnosis not present

## 2022-12-12 DIAGNOSIS — M62552 Muscle wasting and atrophy, not elsewhere classified, left thigh: Secondary | ICD-10-CM | POA: Diagnosis not present

## 2022-12-12 DIAGNOSIS — M25562 Pain in left knee: Secondary | ICD-10-CM | POA: Diagnosis not present

## 2022-12-12 DIAGNOSIS — R2681 Unsteadiness on feet: Secondary | ICD-10-CM | POA: Diagnosis not present

## 2022-12-13 DIAGNOSIS — M25562 Pain in left knee: Secondary | ICD-10-CM | POA: Diagnosis not present

## 2022-12-13 DIAGNOSIS — Z9181 History of falling: Secondary | ICD-10-CM | POA: Diagnosis not present

## 2022-12-13 DIAGNOSIS — M6281 Muscle weakness (generalized): Secondary | ICD-10-CM | POA: Diagnosis not present

## 2022-12-13 DIAGNOSIS — R2681 Unsteadiness on feet: Secondary | ICD-10-CM | POA: Diagnosis not present

## 2022-12-13 DIAGNOSIS — Z87440 Personal history of urinary (tract) infections: Secondary | ICD-10-CM | POA: Diagnosis not present

## 2022-12-13 DIAGNOSIS — W19XXXA Unspecified fall, initial encounter: Secondary | ICD-10-CM | POA: Diagnosis not present

## 2022-12-13 DIAGNOSIS — M62551 Muscle wasting and atrophy, not elsewhere classified, right thigh: Secondary | ICD-10-CM | POA: Diagnosis not present

## 2022-12-13 DIAGNOSIS — M62552 Muscle wasting and atrophy, not elsewhere classified, left thigh: Secondary | ICD-10-CM | POA: Diagnosis not present

## 2022-12-13 DIAGNOSIS — F03918 Unspecified dementia, unspecified severity, with other behavioral disturbance: Secondary | ICD-10-CM | POA: Diagnosis not present

## 2022-12-13 DIAGNOSIS — R2689 Other abnormalities of gait and mobility: Secondary | ICD-10-CM | POA: Diagnosis not present

## 2022-12-16 DIAGNOSIS — M25562 Pain in left knee: Secondary | ICD-10-CM | POA: Diagnosis not present

## 2022-12-16 DIAGNOSIS — M62552 Muscle wasting and atrophy, not elsewhere classified, left thigh: Secondary | ICD-10-CM | POA: Diagnosis not present

## 2022-12-16 DIAGNOSIS — R2681 Unsteadiness on feet: Secondary | ICD-10-CM | POA: Diagnosis not present

## 2022-12-16 DIAGNOSIS — M62551 Muscle wasting and atrophy, not elsewhere classified, right thigh: Secondary | ICD-10-CM | POA: Diagnosis not present

## 2022-12-19 DIAGNOSIS — M62551 Muscle wasting and atrophy, not elsewhere classified, right thigh: Secondary | ICD-10-CM | POA: Diagnosis not present

## 2022-12-19 DIAGNOSIS — M62552 Muscle wasting and atrophy, not elsewhere classified, left thigh: Secondary | ICD-10-CM | POA: Diagnosis not present

## 2022-12-19 DIAGNOSIS — M25562 Pain in left knee: Secondary | ICD-10-CM | POA: Diagnosis not present

## 2022-12-19 DIAGNOSIS — R2681 Unsteadiness on feet: Secondary | ICD-10-CM | POA: Diagnosis not present

## 2022-12-20 DIAGNOSIS — R2681 Unsteadiness on feet: Secondary | ICD-10-CM | POA: Diagnosis not present

## 2022-12-20 DIAGNOSIS — M62551 Muscle wasting and atrophy, not elsewhere classified, right thigh: Secondary | ICD-10-CM | POA: Diagnosis not present

## 2022-12-20 DIAGNOSIS — M25562 Pain in left knee: Secondary | ICD-10-CM | POA: Diagnosis not present

## 2022-12-20 DIAGNOSIS — M62552 Muscle wasting and atrophy, not elsewhere classified, left thigh: Secondary | ICD-10-CM | POA: Diagnosis not present

## 2022-12-24 DIAGNOSIS — R2681 Unsteadiness on feet: Secondary | ICD-10-CM | POA: Diagnosis not present

## 2022-12-24 DIAGNOSIS — M62551 Muscle wasting and atrophy, not elsewhere classified, right thigh: Secondary | ICD-10-CM | POA: Diagnosis not present

## 2022-12-24 DIAGNOSIS — M62552 Muscle wasting and atrophy, not elsewhere classified, left thigh: Secondary | ICD-10-CM | POA: Diagnosis not present

## 2022-12-24 DIAGNOSIS — M25562 Pain in left knee: Secondary | ICD-10-CM | POA: Diagnosis not present

## 2022-12-27 DIAGNOSIS — M25562 Pain in left knee: Secondary | ICD-10-CM | POA: Diagnosis not present

## 2022-12-27 DIAGNOSIS — M62551 Muscle wasting and atrophy, not elsewhere classified, right thigh: Secondary | ICD-10-CM | POA: Diagnosis not present

## 2022-12-27 DIAGNOSIS — M62552 Muscle wasting and atrophy, not elsewhere classified, left thigh: Secondary | ICD-10-CM | POA: Diagnosis not present

## 2022-12-27 DIAGNOSIS — R2681 Unsteadiness on feet: Secondary | ICD-10-CM | POA: Diagnosis not present

## 2022-12-31 DIAGNOSIS — R2681 Unsteadiness on feet: Secondary | ICD-10-CM | POA: Diagnosis not present

## 2022-12-31 DIAGNOSIS — M62552 Muscle wasting and atrophy, not elsewhere classified, left thigh: Secondary | ICD-10-CM | POA: Diagnosis not present

## 2022-12-31 DIAGNOSIS — M25562 Pain in left knee: Secondary | ICD-10-CM | POA: Diagnosis not present

## 2022-12-31 DIAGNOSIS — M62551 Muscle wasting and atrophy, not elsewhere classified, right thigh: Secondary | ICD-10-CM | POA: Diagnosis not present

## 2023-01-01 DIAGNOSIS — M25562 Pain in left knee: Secondary | ICD-10-CM | POA: Diagnosis not present

## 2023-01-01 DIAGNOSIS — M62551 Muscle wasting and atrophy, not elsewhere classified, right thigh: Secondary | ICD-10-CM | POA: Diagnosis not present

## 2023-01-01 DIAGNOSIS — R2681 Unsteadiness on feet: Secondary | ICD-10-CM | POA: Diagnosis not present

## 2023-01-01 DIAGNOSIS — M62552 Muscle wasting and atrophy, not elsewhere classified, left thigh: Secondary | ICD-10-CM | POA: Diagnosis not present

## 2023-01-02 DIAGNOSIS — M62551 Muscle wasting and atrophy, not elsewhere classified, right thigh: Secondary | ICD-10-CM | POA: Diagnosis not present

## 2023-01-02 DIAGNOSIS — M62552 Muscle wasting and atrophy, not elsewhere classified, left thigh: Secondary | ICD-10-CM | POA: Diagnosis not present

## 2023-01-02 DIAGNOSIS — R2681 Unsteadiness on feet: Secondary | ICD-10-CM | POA: Diagnosis not present

## 2023-01-02 DIAGNOSIS — M25562 Pain in left knee: Secondary | ICD-10-CM | POA: Diagnosis not present

## 2023-01-03 DIAGNOSIS — M25562 Pain in left knee: Secondary | ICD-10-CM | POA: Diagnosis not present

## 2023-01-03 DIAGNOSIS — M62551 Muscle wasting and atrophy, not elsewhere classified, right thigh: Secondary | ICD-10-CM | POA: Diagnosis not present

## 2023-01-03 DIAGNOSIS — R2681 Unsteadiness on feet: Secondary | ICD-10-CM | POA: Diagnosis not present

## 2023-01-03 DIAGNOSIS — M62552 Muscle wasting and atrophy, not elsewhere classified, left thigh: Secondary | ICD-10-CM | POA: Diagnosis not present

## 2023-01-06 DIAGNOSIS — M25562 Pain in left knee: Secondary | ICD-10-CM | POA: Diagnosis not present

## 2023-01-06 DIAGNOSIS — R2681 Unsteadiness on feet: Secondary | ICD-10-CM | POA: Diagnosis not present

## 2023-01-06 DIAGNOSIS — M62552 Muscle wasting and atrophy, not elsewhere classified, left thigh: Secondary | ICD-10-CM | POA: Diagnosis not present

## 2023-01-06 DIAGNOSIS — M62551 Muscle wasting and atrophy, not elsewhere classified, right thigh: Secondary | ICD-10-CM | POA: Diagnosis not present

## 2023-01-08 DIAGNOSIS — M25562 Pain in left knee: Secondary | ICD-10-CM | POA: Diagnosis not present

## 2023-01-08 DIAGNOSIS — R2681 Unsteadiness on feet: Secondary | ICD-10-CM | POA: Diagnosis not present

## 2023-01-08 DIAGNOSIS — M62552 Muscle wasting and atrophy, not elsewhere classified, left thigh: Secondary | ICD-10-CM | POA: Diagnosis not present

## 2023-01-08 DIAGNOSIS — M62551 Muscle wasting and atrophy, not elsewhere classified, right thigh: Secondary | ICD-10-CM | POA: Diagnosis not present

## 2023-01-14 DIAGNOSIS — M25562 Pain in left knee: Secondary | ICD-10-CM | POA: Diagnosis not present

## 2023-01-14 DIAGNOSIS — M62552 Muscle wasting and atrophy, not elsewhere classified, left thigh: Secondary | ICD-10-CM | POA: Diagnosis not present

## 2023-01-14 DIAGNOSIS — M62551 Muscle wasting and atrophy, not elsewhere classified, right thigh: Secondary | ICD-10-CM | POA: Diagnosis not present

## 2023-01-14 DIAGNOSIS — R2681 Unsteadiness on feet: Secondary | ICD-10-CM | POA: Diagnosis not present

## 2023-01-16 DIAGNOSIS — M62552 Muscle wasting and atrophy, not elsewhere classified, left thigh: Secondary | ICD-10-CM | POA: Diagnosis not present

## 2023-01-16 DIAGNOSIS — M25562 Pain in left knee: Secondary | ICD-10-CM | POA: Diagnosis not present

## 2023-01-16 DIAGNOSIS — R2681 Unsteadiness on feet: Secondary | ICD-10-CM | POA: Diagnosis not present

## 2023-01-16 DIAGNOSIS — M62551 Muscle wasting and atrophy, not elsewhere classified, right thigh: Secondary | ICD-10-CM | POA: Diagnosis not present

## 2023-01-22 DIAGNOSIS — R2689 Other abnormalities of gait and mobility: Secondary | ICD-10-CM | POA: Diagnosis not present

## 2023-01-22 DIAGNOSIS — F0390 Unspecified dementia without behavioral disturbance: Secondary | ICD-10-CM | POA: Diagnosis not present

## 2023-01-22 DIAGNOSIS — Z9181 History of falling: Secondary | ICD-10-CM | POA: Diagnosis not present

## 2023-01-22 DIAGNOSIS — W19XXXA Unspecified fall, initial encounter: Secondary | ICD-10-CM | POA: Diagnosis not present

## 2023-01-22 DIAGNOSIS — M6281 Muscle weakness (generalized): Secondary | ICD-10-CM | POA: Diagnosis not present

## 2023-01-27 DIAGNOSIS — R2681 Unsteadiness on feet: Secondary | ICD-10-CM | POA: Diagnosis not present

## 2023-01-27 DIAGNOSIS — M62552 Muscle wasting and atrophy, not elsewhere classified, left thigh: Secondary | ICD-10-CM | POA: Diagnosis not present

## 2023-01-27 DIAGNOSIS — M62551 Muscle wasting and atrophy, not elsewhere classified, right thigh: Secondary | ICD-10-CM | POA: Diagnosis not present

## 2023-01-27 DIAGNOSIS — M25562 Pain in left knee: Secondary | ICD-10-CM | POA: Diagnosis not present

## 2023-01-28 DIAGNOSIS — M25562 Pain in left knee: Secondary | ICD-10-CM | POA: Diagnosis not present

## 2023-01-28 DIAGNOSIS — R2681 Unsteadiness on feet: Secondary | ICD-10-CM | POA: Diagnosis not present

## 2023-01-28 DIAGNOSIS — M62551 Muscle wasting and atrophy, not elsewhere classified, right thigh: Secondary | ICD-10-CM | POA: Diagnosis not present

## 2023-01-28 DIAGNOSIS — M62552 Muscle wasting and atrophy, not elsewhere classified, left thigh: Secondary | ICD-10-CM | POA: Diagnosis not present

## 2023-01-29 DIAGNOSIS — M62551 Muscle wasting and atrophy, not elsewhere classified, right thigh: Secondary | ICD-10-CM | POA: Diagnosis not present

## 2023-01-29 DIAGNOSIS — M25562 Pain in left knee: Secondary | ICD-10-CM | POA: Diagnosis not present

## 2023-01-29 DIAGNOSIS — R2681 Unsteadiness on feet: Secondary | ICD-10-CM | POA: Diagnosis not present

## 2023-01-29 DIAGNOSIS — M62552 Muscle wasting and atrophy, not elsewhere classified, left thigh: Secondary | ICD-10-CM | POA: Diagnosis not present

## 2023-01-31 DIAGNOSIS — M25562 Pain in left knee: Secondary | ICD-10-CM | POA: Diagnosis not present

## 2023-01-31 DIAGNOSIS — R2681 Unsteadiness on feet: Secondary | ICD-10-CM | POA: Diagnosis not present

## 2023-01-31 DIAGNOSIS — M62552 Muscle wasting and atrophy, not elsewhere classified, left thigh: Secondary | ICD-10-CM | POA: Diagnosis not present

## 2023-01-31 DIAGNOSIS — M62551 Muscle wasting and atrophy, not elsewhere classified, right thigh: Secondary | ICD-10-CM | POA: Diagnosis not present

## 2023-02-03 DIAGNOSIS — M62551 Muscle wasting and atrophy, not elsewhere classified, right thigh: Secondary | ICD-10-CM | POA: Diagnosis not present

## 2023-02-03 DIAGNOSIS — R2681 Unsteadiness on feet: Secondary | ICD-10-CM | POA: Diagnosis not present

## 2023-02-03 DIAGNOSIS — M62552 Muscle wasting and atrophy, not elsewhere classified, left thigh: Secondary | ICD-10-CM | POA: Diagnosis not present

## 2023-02-03 DIAGNOSIS — M25562 Pain in left knee: Secondary | ICD-10-CM | POA: Diagnosis not present

## 2023-02-04 DIAGNOSIS — M62551 Muscle wasting and atrophy, not elsewhere classified, right thigh: Secondary | ICD-10-CM | POA: Diagnosis not present

## 2023-02-04 DIAGNOSIS — M62552 Muscle wasting and atrophy, not elsewhere classified, left thigh: Secondary | ICD-10-CM | POA: Diagnosis not present

## 2023-02-04 DIAGNOSIS — M25562 Pain in left knee: Secondary | ICD-10-CM | POA: Diagnosis not present

## 2023-02-04 DIAGNOSIS — R2681 Unsteadiness on feet: Secondary | ICD-10-CM | POA: Diagnosis not present

## 2023-02-07 DIAGNOSIS — M25562 Pain in left knee: Secondary | ICD-10-CM | POA: Diagnosis not present

## 2023-02-07 DIAGNOSIS — R2681 Unsteadiness on feet: Secondary | ICD-10-CM | POA: Diagnosis not present

## 2023-02-07 DIAGNOSIS — M62552 Muscle wasting and atrophy, not elsewhere classified, left thigh: Secondary | ICD-10-CM | POA: Diagnosis not present

## 2023-02-07 DIAGNOSIS — M62551 Muscle wasting and atrophy, not elsewhere classified, right thigh: Secondary | ICD-10-CM | POA: Diagnosis not present

## 2023-02-10 DIAGNOSIS — M25562 Pain in left knee: Secondary | ICD-10-CM | POA: Diagnosis not present

## 2023-02-10 DIAGNOSIS — M62551 Muscle wasting and atrophy, not elsewhere classified, right thigh: Secondary | ICD-10-CM | POA: Diagnosis not present

## 2023-02-10 DIAGNOSIS — M62552 Muscle wasting and atrophy, not elsewhere classified, left thigh: Secondary | ICD-10-CM | POA: Diagnosis not present

## 2023-02-10 DIAGNOSIS — R2681 Unsteadiness on feet: Secondary | ICD-10-CM | POA: Diagnosis not present

## 2023-02-26 DIAGNOSIS — F419 Anxiety disorder, unspecified: Secondary | ICD-10-CM

## 2023-02-26 DIAGNOSIS — M6281 Muscle weakness (generalized): Secondary | ICD-10-CM

## 2023-02-26 DIAGNOSIS — E559 Vitamin D deficiency, unspecified: Secondary | ICD-10-CM

## 2023-02-26 DIAGNOSIS — I119 Hypertensive heart disease without heart failure: Secondary | ICD-10-CM | POA: Diagnosis not present

## 2023-02-26 DIAGNOSIS — F39 Unspecified mood [affective] disorder: Secondary | ICD-10-CM

## 2023-02-26 DIAGNOSIS — N3281 Overactive bladder: Secondary | ICD-10-CM

## 2023-02-26 DIAGNOSIS — M1990 Unspecified osteoarthritis, unspecified site: Secondary | ICD-10-CM | POA: Diagnosis not present

## 2023-02-26 DIAGNOSIS — R7303 Prediabetes: Secondary | ICD-10-CM | POA: Diagnosis not present

## 2023-02-26 DIAGNOSIS — G894 Chronic pain syndrome: Secondary | ICD-10-CM

## 2023-02-26 DIAGNOSIS — J449 Chronic obstructive pulmonary disease, unspecified: Secondary | ICD-10-CM | POA: Diagnosis not present

## 2023-02-26 DIAGNOSIS — Z6831 Body mass index (BMI) 31.0-31.9, adult: Secondary | ICD-10-CM

## 2023-02-26 DIAGNOSIS — F03918 Unspecified dementia, unspecified severity, with other behavioral disturbance: Secondary | ICD-10-CM

## 2023-02-27 DIAGNOSIS — E538 Deficiency of other specified B group vitamins: Secondary | ICD-10-CM | POA: Diagnosis not present

## 2023-02-27 DIAGNOSIS — D509 Iron deficiency anemia, unspecified: Secondary | ICD-10-CM | POA: Diagnosis not present

## 2023-02-27 DIAGNOSIS — D649 Anemia, unspecified: Secondary | ICD-10-CM | POA: Diagnosis not present

## 2023-02-27 DIAGNOSIS — E119 Type 2 diabetes mellitus without complications: Secondary | ICD-10-CM | POA: Diagnosis not present

## 2023-02-27 DIAGNOSIS — Z79899 Other long term (current) drug therapy: Secondary | ICD-10-CM | POA: Diagnosis not present

## 2023-02-27 DIAGNOSIS — I10 Essential (primary) hypertension: Secondary | ICD-10-CM | POA: Diagnosis not present

## 2023-02-27 DIAGNOSIS — Z13820 Encounter for screening for osteoporosis: Secondary | ICD-10-CM | POA: Diagnosis not present

## 2023-02-27 DIAGNOSIS — E611 Iron deficiency: Secondary | ICD-10-CM | POA: Diagnosis not present

## 2023-02-28 DIAGNOSIS — F03918 Unspecified dementia, unspecified severity, with other behavioral disturbance: Secondary | ICD-10-CM | POA: Diagnosis not present

## 2023-02-28 DIAGNOSIS — E559 Vitamin D deficiency, unspecified: Secondary | ICD-10-CM | POA: Diagnosis not present

## 2023-03-13 DIAGNOSIS — R456 Violent behavior: Secondary | ICD-10-CM

## 2023-03-13 DIAGNOSIS — R451 Restlessness and agitation: Secondary | ICD-10-CM

## 2023-03-13 DIAGNOSIS — F03918 Unspecified dementia, unspecified severity, with other behavioral disturbance: Secondary | ICD-10-CM

## 2023-03-13 DIAGNOSIS — Z9183 Wandering in diseases classified elsewhere: Secondary | ICD-10-CM

## 2023-03-15 DIAGNOSIS — R9431 Abnormal electrocardiogram [ECG] [EKG]: Secondary | ICD-10-CM | POA: Diagnosis not present

## 2023-03-15 DIAGNOSIS — R531 Weakness: Secondary | ICD-10-CM | POA: Diagnosis not present

## 2023-03-15 DIAGNOSIS — N3000 Acute cystitis without hematuria: Secondary | ICD-10-CM | POA: Diagnosis not present

## 2023-03-15 DIAGNOSIS — G309 Alzheimer's disease, unspecified: Secondary | ICD-10-CM | POA: Diagnosis not present

## 2023-03-16 DIAGNOSIS — G309 Alzheimer's disease, unspecified: Secondary | ICD-10-CM | POA: Diagnosis not present

## 2023-03-16 DIAGNOSIS — N3000 Acute cystitis without hematuria: Secondary | ICD-10-CM | POA: Diagnosis not present

## 2023-03-17 DIAGNOSIS — N390 Urinary tract infection, site not specified: Secondary | ICD-10-CM | POA: Diagnosis not present

## 2023-03-17 DIAGNOSIS — G309 Alzheimer's disease, unspecified: Secondary | ICD-10-CM | POA: Diagnosis not present

## 2023-03-18 DIAGNOSIS — G309 Alzheimer's disease, unspecified: Secondary | ICD-10-CM | POA: Diagnosis not present

## 2023-03-18 DIAGNOSIS — N390 Urinary tract infection, site not specified: Secondary | ICD-10-CM | POA: Diagnosis not present

## 2023-03-19 DIAGNOSIS — N390 Urinary tract infection, site not specified: Secondary | ICD-10-CM | POA: Diagnosis not present

## 2023-03-19 DIAGNOSIS — G309 Alzheimer's disease, unspecified: Secondary | ICD-10-CM | POA: Diagnosis not present

## 2023-03-20 DIAGNOSIS — G309 Alzheimer's disease, unspecified: Secondary | ICD-10-CM | POA: Diagnosis not present

## 2023-03-20 DIAGNOSIS — N390 Urinary tract infection, site not specified: Secondary | ICD-10-CM | POA: Diagnosis not present

## 2023-03-21 DIAGNOSIS — N390 Urinary tract infection, site not specified: Secondary | ICD-10-CM | POA: Diagnosis not present

## 2023-03-21 DIAGNOSIS — G309 Alzheimer's disease, unspecified: Secondary | ICD-10-CM | POA: Diagnosis not present

## 2023-03-22 DIAGNOSIS — G309 Alzheimer's disease, unspecified: Secondary | ICD-10-CM | POA: Diagnosis not present

## 2023-03-22 DIAGNOSIS — N390 Urinary tract infection, site not specified: Secondary | ICD-10-CM | POA: Diagnosis not present

## 2023-03-23 DIAGNOSIS — G309 Alzheimer's disease, unspecified: Secondary | ICD-10-CM | POA: Diagnosis not present

## 2023-03-23 DIAGNOSIS — N390 Urinary tract infection, site not specified: Secondary | ICD-10-CM | POA: Diagnosis not present

## 2023-03-24 DIAGNOSIS — N390 Urinary tract infection, site not specified: Secondary | ICD-10-CM | POA: Diagnosis not present

## 2023-03-24 DIAGNOSIS — G309 Alzheimer's disease, unspecified: Secondary | ICD-10-CM | POA: Diagnosis not present

## 2023-03-25 DIAGNOSIS — N390 Urinary tract infection, site not specified: Secondary | ICD-10-CM | POA: Diagnosis not present

## 2023-03-25 DIAGNOSIS — G309 Alzheimer's disease, unspecified: Secondary | ICD-10-CM | POA: Diagnosis not present

## 2023-03-26 DIAGNOSIS — G309 Alzheimer's disease, unspecified: Secondary | ICD-10-CM | POA: Diagnosis not present

## 2023-03-26 DIAGNOSIS — N390 Urinary tract infection, site not specified: Secondary | ICD-10-CM | POA: Diagnosis not present

## 2023-03-27 DIAGNOSIS — N390 Urinary tract infection, site not specified: Secondary | ICD-10-CM | POA: Diagnosis not present

## 2023-03-27 DIAGNOSIS — G309 Alzheimer's disease, unspecified: Secondary | ICD-10-CM | POA: Diagnosis not present

## 2023-03-28 DIAGNOSIS — K219 Gastro-esophageal reflux disease without esophagitis: Secondary | ICD-10-CM | POA: Diagnosis not present

## 2023-03-28 DIAGNOSIS — Z79899 Other long term (current) drug therapy: Secondary | ICD-10-CM | POA: Diagnosis not present

## 2023-03-28 DIAGNOSIS — N390 Urinary tract infection, site not specified: Secondary | ICD-10-CM | POA: Diagnosis not present

## 2023-03-28 DIAGNOSIS — Z791 Long term (current) use of non-steroidal anti-inflammatories (NSAID): Secondary | ICD-10-CM | POA: Diagnosis not present

## 2023-03-28 DIAGNOSIS — Z87891 Personal history of nicotine dependence: Secondary | ICD-10-CM | POA: Diagnosis not present

## 2023-03-28 DIAGNOSIS — Z7401 Bed confinement status: Secondary | ICD-10-CM | POA: Diagnosis not present

## 2023-03-28 DIAGNOSIS — R410 Disorientation, unspecified: Secondary | ICD-10-CM | POA: Diagnosis not present

## 2023-03-28 DIAGNOSIS — G309 Alzheimer's disease, unspecified: Secondary | ICD-10-CM | POA: Diagnosis not present

## 2023-03-28 DIAGNOSIS — R404 Transient alteration of awareness: Secondary | ICD-10-CM | POA: Diagnosis not present

## 2023-03-28 DIAGNOSIS — Z87440 Personal history of urinary (tract) infections: Secondary | ICD-10-CM | POA: Diagnosis not present

## 2023-03-28 DIAGNOSIS — M1990 Unspecified osteoarthritis, unspecified site: Secondary | ICD-10-CM | POA: Diagnosis not present

## 2023-03-28 DIAGNOSIS — J449 Chronic obstructive pulmonary disease, unspecified: Secondary | ICD-10-CM | POA: Diagnosis not present

## 2023-03-28 DIAGNOSIS — E7800 Pure hypercholesterolemia, unspecified: Secondary | ICD-10-CM | POA: Diagnosis not present

## 2023-03-28 DIAGNOSIS — Z885 Allergy status to narcotic agent status: Secondary | ICD-10-CM | POA: Diagnosis not present

## 2023-03-28 DIAGNOSIS — Z743 Need for continuous supervision: Secondary | ICD-10-CM | POA: Diagnosis not present

## 2023-03-28 DIAGNOSIS — R6889 Other general symptoms and signs: Secondary | ICD-10-CM | POA: Diagnosis not present

## 2023-03-28 DIAGNOSIS — Z880 Allergy status to penicillin: Secondary | ICD-10-CM | POA: Diagnosis not present

## 2023-03-28 DIAGNOSIS — Z91013 Allergy to seafood: Secondary | ICD-10-CM | POA: Diagnosis not present

## 2023-04-14 DIAGNOSIS — L853 Xerosis cutis: Secondary | ICD-10-CM | POA: Diagnosis not present

## 2023-04-14 DIAGNOSIS — M1990 Unspecified osteoarthritis, unspecified site: Secondary | ICD-10-CM | POA: Diagnosis not present

## 2023-04-14 DIAGNOSIS — Z87440 Personal history of urinary (tract) infections: Secondary | ICD-10-CM | POA: Diagnosis not present

## 2023-04-14 DIAGNOSIS — N3281 Overactive bladder: Secondary | ICD-10-CM | POA: Diagnosis not present

## 2023-04-14 DIAGNOSIS — G301 Alzheimer's disease with late onset: Secondary | ICD-10-CM | POA: Diagnosis not present

## 2023-04-21 DIAGNOSIS — L89611 Pressure ulcer of right heel, stage 1: Secondary | ICD-10-CM | POA: Diagnosis not present

## 2023-04-25 DIAGNOSIS — R5381 Other malaise: Secondary | ICD-10-CM | POA: Diagnosis not present

## 2023-04-25 DIAGNOSIS — R5383 Other fatigue: Secondary | ICD-10-CM | POA: Diagnosis not present

## 2023-04-26 DIAGNOSIS — L89612 Pressure ulcer of right heel, stage 2: Secondary | ICD-10-CM | POA: Diagnosis not present

## 2023-04-26 DIAGNOSIS — Z791 Long term (current) use of non-steroidal anti-inflammatories (NSAID): Secondary | ICD-10-CM | POA: Diagnosis not present

## 2023-04-26 DIAGNOSIS — Z79899 Other long term (current) drug therapy: Secondary | ICD-10-CM | POA: Diagnosis not present

## 2023-04-26 DIAGNOSIS — G309 Alzheimer's disease, unspecified: Secondary | ICD-10-CM | POA: Diagnosis not present

## 2023-04-26 DIAGNOSIS — M1990 Unspecified osteoarthritis, unspecified site: Secondary | ICD-10-CM | POA: Diagnosis not present

## 2023-04-26 DIAGNOSIS — Z556 Problems related to health literacy: Secondary | ICD-10-CM | POA: Diagnosis not present

## 2023-04-26 DIAGNOSIS — N3281 Overactive bladder: Secondary | ICD-10-CM | POA: Diagnosis not present

## 2023-04-28 DIAGNOSIS — Z7409 Other reduced mobility: Secondary | ICD-10-CM | POA: Diagnosis not present

## 2023-04-28 DIAGNOSIS — N3281 Overactive bladder: Secondary | ICD-10-CM | POA: Diagnosis not present

## 2023-04-28 DIAGNOSIS — L89610 Pressure ulcer of right heel, unstageable: Secondary | ICD-10-CM | POA: Diagnosis not present

## 2023-04-28 DIAGNOSIS — R7401 Elevation of levels of liver transaminase levels: Secondary | ICD-10-CM | POA: Diagnosis not present

## 2023-04-28 DIAGNOSIS — M1990 Unspecified osteoarthritis, unspecified site: Secondary | ICD-10-CM | POA: Diagnosis not present

## 2023-04-28 DIAGNOSIS — G301 Alzheimer's disease with late onset: Secondary | ICD-10-CM | POA: Diagnosis not present

## 2023-04-28 DIAGNOSIS — M6281 Muscle weakness (generalized): Secondary | ICD-10-CM | POA: Diagnosis not present

## 2023-04-28 DIAGNOSIS — Z87440 Personal history of urinary (tract) infections: Secondary | ICD-10-CM | POA: Diagnosis not present

## 2023-04-28 DIAGNOSIS — N1831 Chronic kidney disease, stage 3a: Secondary | ICD-10-CM | POA: Diagnosis not present

## 2023-05-01 DIAGNOSIS — M1990 Unspecified osteoarthritis, unspecified site: Secondary | ICD-10-CM | POA: Diagnosis not present

## 2023-05-01 DIAGNOSIS — Z79899 Other long term (current) drug therapy: Secondary | ICD-10-CM | POA: Diagnosis not present

## 2023-05-01 DIAGNOSIS — Z791 Long term (current) use of non-steroidal anti-inflammatories (NSAID): Secondary | ICD-10-CM | POA: Diagnosis not present

## 2023-05-01 DIAGNOSIS — N3281 Overactive bladder: Secondary | ICD-10-CM | POA: Diagnosis not present

## 2023-05-01 DIAGNOSIS — L89612 Pressure ulcer of right heel, stage 2: Secondary | ICD-10-CM | POA: Diagnosis not present

## 2023-05-01 DIAGNOSIS — G309 Alzheimer's disease, unspecified: Secondary | ICD-10-CM | POA: Diagnosis not present

## 2023-05-01 DIAGNOSIS — Z556 Problems related to health literacy: Secondary | ICD-10-CM | POA: Diagnosis not present

## 2023-05-02 DIAGNOSIS — G894 Chronic pain syndrome: Secondary | ICD-10-CM | POA: Diagnosis not present

## 2023-05-02 DIAGNOSIS — L89619 Pressure ulcer of right heel, unspecified stage: Secondary | ICD-10-CM | POA: Diagnosis not present

## 2023-05-02 DIAGNOSIS — M1991 Primary osteoarthritis, unspecified site: Secondary | ICD-10-CM | POA: Diagnosis not present

## 2023-05-02 DIAGNOSIS — L89612 Pressure ulcer of right heel, stage 2: Secondary | ICD-10-CM | POA: Diagnosis not present

## 2023-05-02 DIAGNOSIS — Z515 Encounter for palliative care: Secondary | ICD-10-CM | POA: Diagnosis not present

## 2023-05-02 DIAGNOSIS — Z7409 Other reduced mobility: Secondary | ICD-10-CM | POA: Diagnosis not present

## 2023-05-02 DIAGNOSIS — G301 Alzheimer's disease with late onset: Secondary | ICD-10-CM | POA: Diagnosis not present

## 2023-05-02 DIAGNOSIS — N3281 Overactive bladder: Secondary | ICD-10-CM | POA: Diagnosis not present

## 2023-05-02 DIAGNOSIS — R531 Weakness: Secondary | ICD-10-CM | POA: Diagnosis not present

## 2023-05-05 DIAGNOSIS — L89612 Pressure ulcer of right heel, stage 2: Secondary | ICD-10-CM | POA: Diagnosis not present

## 2023-05-05 DIAGNOSIS — G894 Chronic pain syndrome: Secondary | ICD-10-CM | POA: Diagnosis not present

## 2023-05-05 DIAGNOSIS — M1991 Primary osteoarthritis, unspecified site: Secondary | ICD-10-CM | POA: Diagnosis not present

## 2023-05-07 DIAGNOSIS — D72829 Elevated white blood cell count, unspecified: Secondary | ICD-10-CM | POA: Diagnosis not present

## 2023-05-07 DIAGNOSIS — R531 Weakness: Secondary | ICD-10-CM | POA: Diagnosis not present

## 2023-05-07 DIAGNOSIS — G939 Disorder of brain, unspecified: Secondary | ICD-10-CM | POA: Diagnosis not present

## 2023-05-07 DIAGNOSIS — R0902 Hypoxemia: Secondary | ICD-10-CM | POA: Diagnosis not present

## 2023-05-07 DIAGNOSIS — E876 Hypokalemia: Secondary | ICD-10-CM | POA: Diagnosis not present

## 2023-05-07 DIAGNOSIS — R001 Bradycardia, unspecified: Secondary | ICD-10-CM | POA: Diagnosis not present

## 2023-05-07 DIAGNOSIS — I6782 Cerebral ischemia: Secondary | ICD-10-CM | POA: Diagnosis not present

## 2023-05-07 DIAGNOSIS — R7402 Elevation of levels of lactic acid dehydrogenase (LDH): Secondary | ICD-10-CM | POA: Diagnosis not present

## 2023-05-07 DIAGNOSIS — J9690 Respiratory failure, unspecified, unspecified whether with hypoxia or hypercapnia: Secondary | ICD-10-CM | POA: Diagnosis not present

## 2023-05-07 DIAGNOSIS — I469 Cardiac arrest, cause unspecified: Secondary | ICD-10-CM | POA: Diagnosis not present

## 2023-05-07 DIAGNOSIS — I6781 Acute cerebrovascular insufficiency: Secondary | ICD-10-CM | POA: Diagnosis not present

## 2023-05-07 DIAGNOSIS — I499 Cardiac arrhythmia, unspecified: Secondary | ICD-10-CM | POA: Diagnosis not present

## 2023-05-07 DIAGNOSIS — I454 Nonspecific intraventricular block: Secondary | ICD-10-CM | POA: Diagnosis not present

## 2023-05-07 DIAGNOSIS — I4510 Unspecified right bundle-branch block: Secondary | ICD-10-CM | POA: Diagnosis not present

## 2023-05-09 DIAGNOSIS — M1990 Unspecified osteoarthritis, unspecified site: Secondary | ICD-10-CM | POA: Diagnosis not present

## 2023-05-13 DEATH — deceased
